# Patient Record
Sex: Female | Born: 1965
Health system: Southern US, Community
[De-identification: ages and names within clinical notes are randomized; demographics above are authoritative.]

## PROBLEM LIST (undated history)

## (undated) DIAGNOSIS — R1031 Right lower quadrant pain: Secondary | ICD-10-CM

## (undated) DIAGNOSIS — E663 Overweight: Secondary | ICD-10-CM

## (undated) DIAGNOSIS — R197 Diarrhea, unspecified: Secondary | ICD-10-CM

## (undated) DIAGNOSIS — F419 Anxiety disorder, unspecified: Secondary | ICD-10-CM

## (undated) DIAGNOSIS — K589 Irritable bowel syndrome without diarrhea: Secondary | ICD-10-CM

## (undated) DIAGNOSIS — R002 Palpitations: Secondary | ICD-10-CM

## (undated) DIAGNOSIS — R109 Unspecified abdominal pain: Secondary | ICD-10-CM

## (undated) DIAGNOSIS — F329 Major depressive disorder, single episode, unspecified: Secondary | ICD-10-CM

## (undated) DIAGNOSIS — R933 Abnormal findings on diagnostic imaging of other parts of digestive tract: Secondary | ICD-10-CM

## (undated) DIAGNOSIS — T7840XA Allergy, unspecified, initial encounter: Secondary | ICD-10-CM

## (undated) DIAGNOSIS — F32A Depression, unspecified: Secondary | ICD-10-CM

## (undated) DIAGNOSIS — K219 Gastro-esophageal reflux disease without esophagitis: Secondary | ICD-10-CM

## (undated) DIAGNOSIS — R740 Nonspecific elevation of levels of transaminase and lactic acid dehydrogenase [LDH]: Secondary | ICD-10-CM

## (undated) DIAGNOSIS — I10 Essential (primary) hypertension: Secondary | ICD-10-CM

## (undated) HISTORY — DX: Allergy, unspecified, initial encounter: T78.40XA

## (undated) HISTORY — DX: Gastro-esophageal reflux disease without esophagitis: K21.9

## (undated) HISTORY — DX: Unspecified abdominal pain: R10.9

## (undated) HISTORY — DX: Nonspecific elevation of levels of transaminase and lactic acid dehydrogenase (ldh): R74.0

## (undated) HISTORY — PX: UPPER GI ENDOSCOPY: SHX6162

## (undated) HISTORY — PX: BUNIONECTOMY: SHX129

## (undated) HISTORY — DX: Overweight: E66.3

## (undated) HISTORY — PX: COLONOSCOPY: SHX174

## (undated) HISTORY — DX: Palpitations: R00.2

## (undated) HISTORY — DX: Right lower quadrant pain: R10.31

## (undated) HISTORY — DX: Anxiety disorder, unspecified: F41.9

## (undated) HISTORY — PX: TREATMENT FISTULA ANAL: SUR1390

## (undated) HISTORY — DX: Depression, unspecified: F32.A

## (undated) HISTORY — DX: Abnormal findings on diagnostic imaging of other parts of digestive tract: R93.3

## (undated) HISTORY — DX: Diarrhea, unspecified: R19.7

## (undated) HISTORY — PX: WISDOM TOOTH EXTRACTION: SHX21

## (undated) HISTORY — PX: PLANTAR FASCIA RELEASE: SHX2239

## (undated) HISTORY — DX: Essential (primary) hypertension: I10

## (undated) HISTORY — PX: ADENOIDECTOMY: SUR15

## (undated) HISTORY — DX: Irritable bowel syndrome without diarrhea: K58.9

## (undated) HISTORY — DX: Major depressive disorder, single episode, unspecified: F32.9

---

## 2005-09-05 ENCOUNTER — Ambulatory Visit (HOSPITAL_BASED_OUTPATIENT_CLINIC_OR_DEPARTMENT_OTHER): Admission: RE | Admit: 2005-09-05 | Discharge: 2005-09-05 | Payer: Self-pay | Admitting: Surgery

## 2005-09-05 ENCOUNTER — Ambulatory Visit (HOSPITAL_COMMUNITY): Admission: RE | Admit: 2005-09-05 | Discharge: 2005-09-05 | Payer: Self-pay | Admitting: Surgery

## 2005-09-25 ENCOUNTER — Ambulatory Visit (HOSPITAL_BASED_OUTPATIENT_CLINIC_OR_DEPARTMENT_OTHER): Admission: RE | Admit: 2005-09-25 | Discharge: 2005-09-25 | Payer: Self-pay | Admitting: Surgery

## 2005-09-25 ENCOUNTER — Ambulatory Visit (HOSPITAL_COMMUNITY): Admission: RE | Admit: 2005-09-25 | Discharge: 2005-09-25 | Payer: Self-pay | Admitting: Surgery

## 2006-08-29 ENCOUNTER — Encounter: Admission: RE | Admit: 2006-08-29 | Discharge: 2006-08-29 | Payer: Self-pay | Admitting: Internal Medicine

## 2006-09-12 ENCOUNTER — Ambulatory Visit: Payer: Self-pay | Admitting: Gastroenterology

## 2006-10-28 ENCOUNTER — Encounter (INDEPENDENT_AMBULATORY_CARE_PROVIDER_SITE_OTHER): Payer: Self-pay | Admitting: Specialist

## 2006-10-28 ENCOUNTER — Ambulatory Visit: Payer: Self-pay | Admitting: Gastroenterology

## 2007-01-31 ENCOUNTER — Emergency Department (HOSPITAL_COMMUNITY): Admission: EM | Admit: 2007-01-31 | Discharge: 2007-01-31 | Payer: Self-pay | Admitting: Emergency Medicine

## 2007-02-03 ENCOUNTER — Ambulatory Visit: Payer: Self-pay | Admitting: Cardiology

## 2007-10-28 ENCOUNTER — Encounter: Admission: RE | Admit: 2007-10-28 | Discharge: 2007-10-28 | Payer: Self-pay | Admitting: Internal Medicine

## 2008-03-29 ENCOUNTER — Other Ambulatory Visit: Admission: RE | Admit: 2008-03-29 | Discharge: 2008-03-29 | Payer: Self-pay | Admitting: Internal Medicine

## 2008-11-26 ENCOUNTER — Ambulatory Visit: Payer: Self-pay | Admitting: Internal Medicine

## 2008-12-10 ENCOUNTER — Ambulatory Visit: Payer: Self-pay | Admitting: Cardiology

## 2009-02-22 ENCOUNTER — Ambulatory Visit: Payer: Self-pay | Admitting: Internal Medicine

## 2009-03-14 ENCOUNTER — Encounter: Admission: RE | Admit: 2009-03-14 | Discharge: 2009-03-14 | Payer: Self-pay | Admitting: Chiropractic Medicine

## 2009-03-17 ENCOUNTER — Encounter: Admission: RE | Admit: 2009-03-17 | Discharge: 2009-03-17 | Payer: Self-pay | Admitting: Internal Medicine

## 2009-05-26 ENCOUNTER — Other Ambulatory Visit: Admission: RE | Admit: 2009-05-26 | Discharge: 2009-05-26 | Payer: Self-pay | Admitting: Internal Medicine

## 2009-05-26 ENCOUNTER — Ambulatory Visit: Payer: Self-pay | Admitting: Internal Medicine

## 2009-05-31 ENCOUNTER — Ambulatory Visit: Payer: Self-pay | Admitting: Internal Medicine

## 2009-07-14 ENCOUNTER — Ambulatory Visit: Payer: Self-pay | Admitting: Internal Medicine

## 2009-07-15 ENCOUNTER — Encounter (INDEPENDENT_AMBULATORY_CARE_PROVIDER_SITE_OTHER): Payer: Self-pay | Admitting: *Deleted

## 2009-07-15 ENCOUNTER — Encounter: Admission: RE | Admit: 2009-07-15 | Discharge: 2009-07-15 | Payer: Self-pay | Admitting: Internal Medicine

## 2009-08-01 ENCOUNTER — Ambulatory Visit: Payer: Self-pay | Admitting: Internal Medicine

## 2009-08-30 ENCOUNTER — Encounter (INDEPENDENT_AMBULATORY_CARE_PROVIDER_SITE_OTHER): Payer: Self-pay | Admitting: *Deleted

## 2009-08-30 ENCOUNTER — Ambulatory Visit: Payer: Self-pay | Admitting: Internal Medicine

## 2009-08-30 ENCOUNTER — Encounter: Admission: RE | Admit: 2009-08-30 | Discharge: 2009-08-30 | Payer: Self-pay | Admitting: Internal Medicine

## 2009-09-22 ENCOUNTER — Encounter: Payer: Self-pay | Admitting: Gastroenterology

## 2009-10-11 ENCOUNTER — Ambulatory Visit: Payer: Self-pay | Admitting: Internal Medicine

## 2009-12-14 ENCOUNTER — Encounter (INDEPENDENT_AMBULATORY_CARE_PROVIDER_SITE_OTHER): Payer: Self-pay | Admitting: *Deleted

## 2009-12-21 ENCOUNTER — Telehealth: Payer: Self-pay | Admitting: Gastroenterology

## 2009-12-22 ENCOUNTER — Encounter: Payer: Self-pay | Admitting: Gastroenterology

## 2009-12-22 ENCOUNTER — Ambulatory Visit: Payer: Self-pay | Admitting: Gastroenterology

## 2009-12-22 DIAGNOSIS — I10 Essential (primary) hypertension: Secondary | ICD-10-CM

## 2009-12-22 DIAGNOSIS — R197 Diarrhea, unspecified: Secondary | ICD-10-CM

## 2009-12-22 DIAGNOSIS — E785 Hyperlipidemia, unspecified: Secondary | ICD-10-CM | POA: Insufficient documentation

## 2009-12-22 DIAGNOSIS — R1031 Right lower quadrant pain: Secondary | ICD-10-CM

## 2009-12-22 DIAGNOSIS — R933 Abnormal findings on diagnostic imaging of other parts of digestive tract: Secondary | ICD-10-CM | POA: Insufficient documentation

## 2009-12-22 HISTORY — DX: Diarrhea, unspecified: R19.7

## 2009-12-22 HISTORY — DX: Abnormal findings on diagnostic imaging of other parts of digestive tract: R93.3

## 2009-12-22 HISTORY — DX: Right lower quadrant pain: R10.31

## 2009-12-22 HISTORY — DX: Essential (primary) hypertension: I10

## 2009-12-23 ENCOUNTER — Telehealth: Payer: Self-pay | Admitting: Gastroenterology

## 2009-12-23 LAB — CONVERTED CEMR LAB
Basophils Relative: 1.3 % (ref 0.0–3.0)
Eosinophils Absolute: 0.5 10*3/uL (ref 0.0–0.7)
Eosinophils Relative: 6.4 % — ABNORMAL HIGH (ref 0.0–5.0)
HCT: 39.9 % (ref 36.0–46.0)
Lymphs Abs: 1.5 10*3/uL (ref 0.7–4.0)
MCHC: 34 g/dL (ref 30.0–36.0)
MCV: 89.4 fL (ref 78.0–100.0)
Monocytes Absolute: 0.5 10*3/uL (ref 0.1–1.0)
Neutro Abs: 5 10*3/uL (ref 1.4–7.7)
Neutrophils Relative %: 65.7 % (ref 43.0–77.0)
RBC: 4.46 M/uL (ref 3.87–5.11)

## 2009-12-27 ENCOUNTER — Ambulatory Visit: Payer: Self-pay | Admitting: Gastroenterology

## 2009-12-28 ENCOUNTER — Telehealth: Payer: Self-pay | Admitting: Gastroenterology

## 2009-12-29 ENCOUNTER — Encounter: Payer: Self-pay | Admitting: Gastroenterology

## 2010-01-02 ENCOUNTER — Telehealth: Payer: Self-pay | Admitting: Gastroenterology

## 2010-01-10 ENCOUNTER — Encounter (INDEPENDENT_AMBULATORY_CARE_PROVIDER_SITE_OTHER): Payer: Self-pay | Admitting: *Deleted

## 2010-01-10 ENCOUNTER — Ambulatory Visit: Payer: Self-pay | Admitting: Gastroenterology

## 2010-01-10 DIAGNOSIS — R1084 Generalized abdominal pain: Secondary | ICD-10-CM | POA: Insufficient documentation

## 2010-01-10 DIAGNOSIS — R7401 Elevation of levels of liver transaminase levels: Secondary | ICD-10-CM

## 2010-01-10 DIAGNOSIS — K589 Irritable bowel syndrome without diarrhea: Secondary | ICD-10-CM

## 2010-01-10 DIAGNOSIS — F411 Generalized anxiety disorder: Secondary | ICD-10-CM | POA: Insufficient documentation

## 2010-01-10 DIAGNOSIS — R740 Nonspecific elevation of levels of transaminase and lactic acid dehydrogenase [LDH]: Secondary | ICD-10-CM

## 2010-01-10 DIAGNOSIS — R109 Unspecified abdominal pain: Secondary | ICD-10-CM

## 2010-01-10 HISTORY — DX: Elevation of levels of liver transaminase levels: R74.01

## 2010-01-10 HISTORY — DX: Unspecified abdominal pain: R10.9

## 2010-01-10 HISTORY — DX: Irritable bowel syndrome, unspecified: K58.9

## 2010-01-12 ENCOUNTER — Encounter: Admission: RE | Admit: 2010-01-12 | Discharge: 2010-01-12 | Payer: Self-pay | Admitting: Gastroenterology

## 2010-01-20 ENCOUNTER — Ambulatory Visit (HOSPITAL_COMMUNITY): Admission: RE | Admit: 2010-01-20 | Discharge: 2010-01-20 | Payer: Self-pay | Admitting: Gastroenterology

## 2010-01-26 ENCOUNTER — Ambulatory Visit: Payer: Self-pay | Admitting: Gastroenterology

## 2010-02-08 ENCOUNTER — Telehealth: Payer: Self-pay | Admitting: Gastroenterology

## 2010-02-10 ENCOUNTER — Telehealth: Payer: Self-pay | Admitting: Gastroenterology

## 2010-02-15 ENCOUNTER — Ambulatory Visit: Payer: Self-pay | Admitting: Internal Medicine

## 2010-02-21 ENCOUNTER — Telehealth: Payer: Self-pay | Admitting: Gastroenterology

## 2010-03-29 ENCOUNTER — Encounter: Admission: RE | Admit: 2010-03-29 | Discharge: 2010-03-29 | Payer: Self-pay | Admitting: Family Medicine

## 2010-08-01 ENCOUNTER — Other Ambulatory Visit: Admission: RE | Admit: 2010-08-01 | Discharge: 2010-08-01 | Payer: Self-pay | Admitting: Family Medicine

## 2010-12-09 ENCOUNTER — Encounter: Payer: Self-pay | Admitting: Internal Medicine

## 2010-12-19 NOTE — Progress Notes (Signed)
Summary: Speak to Dr Russella Dar   Phone Note Call from Patient Call back at Home Phone [phone]   Caller: Erie Noe @ Dr Cleta Alberts [phone]  Call For: Dr Russella Dar Summary of Call: Would like to speak to Dr Russella Dar today please.  Initial call taken by: Leanor Kail [facility],  December 21, 2009 12:54 PM  Follow-up for Phone Call        I have left a message for the patient to call me back about scheduling an appointment .  Dr Russella Dar has spoke with Dr Cleta Alberts and patient has RUQ tenderness and a abnormal CT scan. Follow-up by: Darcey Nora RN, CGRN,  December 21, 2009 2:20 PM  Additional Follow-up for Phone Call Additional follow up Details #1::        Patient  is scheduled to come see Mike Gip PA 2-3-119:00 Additional Follow-up by: Darcey Nora RN, CGRN,  December 21, 2009 2:37 PM

## 2010-12-19 NOTE — Progress Notes (Signed)
Summary: Meds are making her sick  Medications Added DEXILANT 60 MG CPDR (DEXLANSOPRAZOLE) 1 by mouth qd NEXIUM 40 MG CPDR (ESOMEPRAZOLE MAGNESIUM) 1 by mouth qd       Phone Note Call from Patient Call back at Work Phone [phone]   Call For: Dr Russella Dar Summary of Call: Meds is making her sick. Initial call taken by: Leanor Kail [facility],  February 10, 2010 10:08 AM  Follow-up for Phone Call        Left message for patient to call back Darcey Nora RN, [facility]  February 10, 2010 10:56 AM  Patient has bloating, diarrhea and abdominal pain  usually starts several hours after her PPI.  She has tried taking  Prilosec and Protonix. Same symptoms with both.  Please advise.    Follow-up by: Darcey Nora RN, CGRN,  February 10, 2010 11:36 AM  Additional Follow-up for Phone Call Additional follow up Details #1::        She needs a PPI for erosive esophagitis. Try Aciphex or Nexium for 5-10 days each. Can provide samples if available. Additional Follow-up by: Meryl Dare MD [facility] Graham,  February 10, 2010 11:51 AM    Additional Follow-up for Phone Call Additional follow up Details #2::    We have no samples of Aciphex.  I have given her 2 weeks each of Nexium and Dexilant.  She will call me back with which one helps.   Follow-up by: Darcey Nora RN, CGRN,  February 10, 2010 11:56 AM  New/Updated Medications: DEXILANT 60 MG CPDR (DEXLANSOPRAZOLE) 1 by mouth qd NEXIUM 40 MG CPDR (ESOMEPRAZOLE MAGNESIUM) 1 by mouth qd

## 2010-12-19 NOTE — Progress Notes (Signed)
Summary: results and procedure ?   Phone Note Call from Patient Call back at [facility] Phone [phone]   Caller: Patient Call For: Dr. Russella Dar Reason for Call: Talk to Nurse Summary of Call: 1. can pt wear her contacts during her procedure? 2. would like labwork results Initial call taken by: Vallarie Mare,  December 23, 2009 8:31 AM  Follow-up for Phone Call        patient advised ok to wear contacts, and of lab results. Follow-up by: Darcey Nora RN, CGRN,  December 23, 2009 8:38 AM

## 2010-12-19 NOTE — Letter (Signed)
Summary: New Patient letter  [facility]  [address] [place], [place] [address]   Phone: [phone]  Fax: [phone]       12/14/2009 MRN: [id number]  Alejandra Reed [address] [place], [place]  [address]  Dear Ms. Alejandra Reed,  Welcome to the Gastroenterology Division at [facility].    You are scheduled to see Dr. Russella Dar on 01-10-10 at 8:45a.m. on the 3rd floor at [facility], [address]  We ask that you try to arrive at our office 15 minutes prior to your appointment time to allow for check-in.  We would like you to complete the enclosed self-administered evaluation form prior to your visit and bring it with you on the day of your appointment.  We will review it with you.  Also, please bring a complete list of all your medications or, if you prefer, bring the medication bottles and we will list them.  Please bring your insurance card so that we may make a copy of it.  If your insurance requires a referral to see a specialist, please bring your referral form from your primary care physician.  Co-payments are due at the time of your visit and may be paid by cash, check or credit card.     Your office visit will consist of a consult with your physician (includes a physical exam), any laboratory testing he/she may order, scheduling of any necessary diagnostic testing (e.g. x-ray, ultrasound, CT-scan), and scheduling of a procedure (e.g. Endoscopy, Colonoscopy) if required.  Please allow enough time on your schedule to allow for any/all of these possibilities.    If you cannot keep your appointment, please call [phone] to cancel or reschedule prior to your appointment date.  This allows [place] the opportunity to schedule an appointment for another patient in need of care.  If you do not cancel or reschedule by 5 p.m. the business day prior to your appointment date, you will be charged a $50.00 late cancellation/no-show fee.    Thank you for choosing  [facility] for your medical needs.  We appreciate the opportunity to care for you.  Please visit [place] at our website  to learn more about our practice.                     Sincerely,                                                             The Gastroenterology Division

## 2010-12-19 NOTE — Procedures (Signed)
Summary: Colonoscopy and biopsy   Colonoscopy  Procedure date:  10/28/2006  Findings:      Results: Normal. Location:  [facility].    Procedures Next Due Date:    Colonoscopy: 10/2011 Patient Name: Alejandra Reed, Alejandra Reed MRN:  Procedure Procedures: Colonoscopy CPT: [address].    with biopsy. CPT: Q5068410.  Personnel: Endoscopist: Venita Lick. Russella Dar, MD, Clementeen Graham.  Referred By: Sharlet Salina, MD.  Exam Location: Exam performed in Outpatient Clinic. Outpatient  Patient Consent: Procedure, Alternatives, Risks and Benefits discussed, consent obtained, from patient. Consent was obtained by the RN.  Indications Symptoms: Diarrhea Abdominal pain / bloating.  Increased Risk Screening: For family history of colorectal neoplasia, in  parent age at onset: 58.  Comments: Mother with colon cancer and a 24 yo sister with colon polyps. History  Current Medications: Patient is not currently taking Coumadin.  Pre-Exam Physical: Performed Oct 28, 2006. Cardio-pulmonary exam, Rectal exam, HEENT exam , Abdominal exam, Mental status exam WNL.  Comments: Pt. history reviewed/updated, physical exam performed prior to initiation of sedation?Yes Exam Exam: Extent of exam reached: Terminal Ileum, extent intended: Terminal Ileum.  The cecum was identified by appendiceal orifice and IC valve. Time to Cecum: 00:00:50. Time for Withdrawl: 00:08:25. Colon retroflexion performed. ASA Classification: II. Tolerance: excellent.  Monitoring: Pulse and BP monitoring, Oximetry used. Supplemental O2 given.  Colon Prep Used MoviPrep' for colon prep. Prep results: excellent.  Sedation Meds: Patient assessed and found to be appropriate for moderate (conscious) sedation. Fentanyl 75 mcg. given IV. Versed 7 mg. given IV.  Findings NORMAL EXAM: Terminal Ileum.  NORMAL EXAM: Cecum to Rectum. Biopsy/Normal Exam taken. Comments: random biopsies taken.   Assessment Normal examination.   Events  Unplanned Interventions: No intervention was required.  Unplanned Events: There were no complications. Plans  Post Exam Instructions: Post sedation instructions given.  Medication Plan: Await pathology.  Patient Education: Patient given standard instructions for: IBS.  Disposition: After procedure patient sent to recovery. After recovery patient sent home.  Scheduling/Referral: Colonoscopy, to [facility] T. Russella Dar, MD, [facility], around Oct 29, 2011.    This report was created from the original endoscopy report, which was reviewed and signed by the above listed endoscopist.    cc: Sharlet Salina, MD         SP Surgical Pathology - STATUS: Final             By: Morrie Sheldon,       Perform Date: 10Dec07 00:01  Ordered By: Rica Records Date: 10Dec07 22:02  Facility: [facility]                               Department: CPATH  Service Report Text  [facility]   [address] [address]   [place], [place] [address]   Telephone [phone] or [phone] Fax [phone]    REPORT OF SURGICAL PATHOLOGY    Case #: [id number]   Patient Name: Alejandra Reed, Alejandra Reed   Office Chart Number: [id number]    MRN: [id number]   Pathologist: Beulah Gandy. Luisa Hart, MD   DOB/Age 12-08-1965 (Age: 45) Gender: F   Date Taken: 10/28/2006   Date Received: 10/28/2006    FINAL DIAGNOSIS    ***MICROSCOPIC EXAMINATION AND DIAGNOSIS***    COLON, RANDOM BIOPSIES: BENIGN COLONIC MUCOSA. NO ACTIVE   INFLAMMATION, MICROSCOPIC COLITIS OR GRANULOMAS.  COMMENT   There is colorectal mucosa with normal crypt architecture and no   objective increase in inflammation. No active inflammation,   microscopic colitis, collagenous colitis or significant chronic   changes identified. No hyperplastic or adenomatous changes are   seen, and there is no evidence of malignancy. (JDP:caf   10/29/06)    cf   Date Reported: 10/29/2006 Beulah Gandy. Luisa Hart, MD   ***  Electronically Signed Out By JDP ***    Clinical information   Diarrhea R/O colitis (jes)    specimen(s) obtained   Colon, biopsy, random    Gross Description   Received in formalin are tan, soft tissue fragments that are   submitted in toto. Number: multiple.   Size: 0.4 to 0.7 cm One block (ML:jes,10/29/06)    jes/

## 2010-12-19 NOTE — Letter (Signed)
Summary: Patient Notice- Colon Biospy Results  [facility]  [address] [place], [place] [address]   Phone: [phone]  Fax: [phone]        December 29, 2009 MRN: [id number]    TAL NEER [address] [place], [place]  [address]    Dear Ms. Mottern,  I am pleased to inform you that the biopsies taken during your recent colonoscopy did not show any evidence of cancer upon pathologic examination. The biopsies were normal.  Continue with the treatment plan as outlined on the day of your      exam.  You should have a repeat colonoscopy examination in 5 years.  Please call us if you are having persistent problems or have questions about your condition that have not been fully answered at this time.  Sincerely,  Meryl Dare MD [facility]  This letter has been electronically signed by your physician.  Appended Document: Patient Notice- Colon Biospy Results letter mailed 2.11.11

## 2010-12-19 NOTE — Procedures (Signed)
Summary: Colonoscopy  Patient: Alejandra Reed Note: All result statuses are Final unless otherwise noted.  Tests: (1) Colonoscopy (COL)   COL Colonoscopy           DONE     [facility]     [address]     [place], [place]  [address]           COLONOSCOPY PROCEDURE REPORT           PATIENT:  Alejandra Reed, Alejandra Reed  MR#:  [id number]     BIRTHDATE:  August 19, 1966, 43 yrs. old  GENDER:  female           ENDOSCOPIST:  Judie Petit T. Russella Dar, MD, [facility]           PROCEDURE DATE:  12/27/2009     PROCEDURE:  Colonoscopy with biopsy     ASA CLASS:  Class II     INDICATIONS:  1) unexplained diarrhea, constipation  2) abnormal     CT of abdomen  3) abdominal pain-RLQ  4) family history of colon     cancer-mother with colon cancer at 79.           MEDICATIONS:   Fentanyl 75 mcg IV, Versed 8 mg IV           DESCRIPTION OF PROCEDURE:   After the risks benefits and     alternatives of the procedure were thoroughly explained, informed     consent was obtained.  Digital rectal exam was performed and     revealed no abnormalities.   The LB [id number]-[id number] endoscope     was introduced through the anus and advanced to the terminal ileum     which was intubated for a short distance, without limitations.     The quality of the prep was good, using MoviPrep.  The instrument     was then slowly withdrawn as the colon was fully examined.     <<PROCEDUREIMAGES>>           FINDINGS:  The terminal ileum appeared normal. Random biopsies     were obtained and sent to pathology.  A normal appearing cecum,     ileocecal valve, and appendiceal orifice were identified. The     ascending, hepatic flexure, transverse, splenic flexure,     descending, sigmoid colon, and rectum appeared unremarkable.     Random biopsies were obtained and sent to pathology. Retroflexed     views in the rectum revealed internal hemorrhoids, small. The time     to cecum =  1.33  minutes. The scope was then withdrawn (time =     8.25   min) from the patient and the procedure completed.           COMPLICATIONS:  None           ENDOSCOPIC IMPRESSION:     1) Normal terminal ileum     2) Normal colon     3) Internal hemorrhoids           RECOMMENDATIONS:     1) Await pathology results     2) call office to schedule followup visit in 4-6 weeks     3) Repeat Colonoscopy in 5 years.     4) DC Bentyl     5) glycopyrrolate 2mg  po bid prn abd pain/bloating, #60, 5 refills                 Ameenah Prosser T. Russella Dar, MD, Clementeen Graham  CC: Lesle Chris, MD           n.     Rosalie DoctorVenita Lick. Burt Piatek at 12/27/2009 03:38 PM           Valentina Gu, [id number]  Note: An exclamation mark (!) indicates a result that was not dispersed into the flowsheet. Document Creation Date: 12/28/2009 10:18 AM _______________________________________________________________________  (1) Order result status: Final Collection or observation date-time: 12/27/2009 15:30 Requested date-time:  Receipt date-time:  Reported date-time:  Referring Physician:   Ordering Physician: Claudette Head [phone]) Specimen Source:  Source: Launa Grill Order Number: [id number] Lab site:   Appended Document: Colonoscopy     Procedures Next Due Date:    Colonoscopy: 12/2014

## 2010-12-19 NOTE — Letter (Signed)
Summary: EGD Instructions  [facility]  [address] [place], [place] [address]   Phone: [phone]  Fax: [phone]       Alejandra Reed    June 12, 1966    MRN: [id number]       Procedure Day /Date: Thursday March 10th, 2011     Arrival Time:  2:00pm     Procedure Time: 3:00pm     Location of Procedure:                    _  x_ [facility] (4th Floor)    PREPARATION FOR ENDOSCOPY   On 01/26/10  THE DAY OF THE PROCEDURE:  1.   No solid foods, milk or milk products are allowed after midnight the night before your procedure.  2.   Do not drink anything colored red or purple.  Avoid juices with pulp.  No orange juice.  3.  You may drink clear liquids until 1:00pm , which is 2 hours before your procedure.                                                                                                CLEAR LIQUIDS INCLUDE: Water Jello Ice Popsicles Tea (sugar ok, no milk/cream) Powdered fruit flavored drinks Coffee (sugar ok, no milk/cream) Gatorade Juice: apple, white grape, white cranberry  Lemonade Clear bullion, consomm, broth Carbonated beverages (any kind) Strained chicken noodle soup Hard Candy   MEDICATION INSTRUCTIONS  Unless otherwise instructed, you should take regular prescription medications with a small sip of water as early as possible the morning of your procedure.           OTHER INSTRUCTIONS  You will need a responsible adult at least 45 years of age to accompany you and drive you home.   This person must remain in the waiting room during your procedure.  Wear loose fitting clothing that is easily removed.  Leave jewelry and other valuables at home.  However, you may wish to bring a book to read or an iPod/MP3 player to listen to music as you wait for your procedure to start.  Remove all body piercing jewelry and leave at home.  Total time from sign-in until discharge is approximately 2-3 hours.  You should go  home directly after your procedure and rest.  You can resume normal activities the day after your procedure.  The day of your procedure you should not:   Drive   Make legal decisions   Operate machinery   Drink alcohol   Return to work  You will receive specific instructions about eating, activities and medications before you leave.    The above instructions have been reviewed and explained to me by   Marchelle Folks.     I fully understand and can verbalize these instructions _____________________________ Date _________

## 2010-12-19 NOTE — Letter (Signed)
Summary: [facility] Instructions  [facility]  [address] [place], [place] [address]   Phone: [phone]  Fax: [phone]       SAPIR LAVEY    1966-08-07    MRN: [id number]        Procedure Day /Date: 12-27-09     Arrival Time: 2:30 PM     Procedure Time: 3:30 PM     Location of Procedure:                    X     [facility] (4th Floor)  PREPARATION FOR COLONOSCOPY WITH MOVIPREP   Starting 5 days prior to your procedure 12-22-09 do not eat nuts, seeds, popcorn, corn, beans, peas,  salads, or any raw vegetables.  Do not take any fiber supplements (e.g. Metamucil, Citrucel, and Benefiber).  THE DAY BEFORE YOUR PROCEDURE         DATE: 12-26-09   DAY: Monday  1.  Drink clear liquids the entire day-NO SOLID FOOD  2.  Do not drink anything colored red or purple.  Avoid juices with pulp.  No orange juice.  3.  Drink at least 64 oz. (8 glasses) of fluid/clear liquids during the day to prevent dehydration and help the prep work efficiently.  CLEAR LIQUIDS INCLUDE: Water Jello Ice Popsicles Tea (sugar ok, no milk/cream) Powdered fruit flavored drinks Coffee (sugar ok, no milk/cream) Gatorade Juice: apple, white grape, white cranberry  Lemonade Clear bullion, consomm, broth Carbonated beverages (any kind) Strained chicken noodle soup Hard Candy                             4.  In the morning, mix first dose of MoviPrep solution:    Empty 1 Pouch A and 1 Pouch B into the disposable container    Add lukewarm drinking water to the top line of the container. Mix to dissolve    Refrigerate (mixed solution should be used within 24 hrs)  5.  Begin drinking the prep at 5:00 p.m. The MoviPrep container is divided by 4 marks.   Every 15 minutes drink the solution down to the next mark (approximately 8 oz) until the full liter is complete.   6.  Follow completed prep with 16 oz of clear liquid of your choice (Nothing red or purple).  Continue to drink  clear liquids until bedtime.  7.  Before going to bed, mix second dose of MoviPrep solution:    Empty 1 Pouch A and 1 Pouch B into the disposable container    Add lukewarm drinking water to the top line of the container. Mix to dissolve    Refrigerate  THE DAY OF YOUR PROCEDURE      DATE: 12-27-09 DAY: Tuesday  Beginning at 10:30 AM (5 hours before procedure):         1. Every 15 minutes, drink the solution down to the next mark (approx 8 oz) until the full liter is complete.  2. Follow completed prep with 16 oz. of clear liquid of your choice.    3. You may drink clear liquids until 1:30 PM  (2 HOURS BEFORE PROCEDURE).   MEDICATION INSTRUCTIONS  Unless otherwise instructed, you should take regular prescription medications with a small sip of water   as early as possible the morning of your procedure.       OTHER INSTRUCTIONS  You will need a responsible adult at least  45 years of age to accompany you and drive you home.   This person must remain in the waiting room during your procedure.  Wear loose fitting clothing that is easily removed.  Leave jewelry and other valuables at home.  However, you may wish to bring a book to read or  an iPod/MP3 player to listen to music as you wait for your procedure to start.  Remove all body piercing jewelry and leave at home.  Total time from sign-in until discharge is approximately 2-3 hours.  You should go home directly after your procedure and rest.  You can resume normal activities the  day after your procedure.  The day of your procedure you should not:   Drive   Make legal decisions   Operate machinery   Drink alcohol   Return to work  You will receive specific instructions about eating, activities and medications before you leave.    The above instructions have been reviewed and explained to me by   _______________________    I fully understand and can verbalize these instructions  _____________________________ Date _________

## 2010-12-19 NOTE — Assessment & Plan Note (Signed)
Summary: ruq pain, abnormal CT scan    History of Present Illness Visit Type: consult  Primary GI MD: Elie Goody MD [facility] Primary Provider: Marlan Palau, MD  Requesting Provider: Lesle Chris, MD  Chief Complaint: RUQ abd pain and abdnomal ct  History of Present Illness:   45 YO FEMALE KNOWN TO DR. Russella Dar FROM PRIOR COLONOSCOPY DONE IN 12/07 FOR SCREENING WITH FAMILY HX OF COLON CANCER IN PTS MOTHER. SHE ALSO HAS A SISTER WITH CROHNS DISEASE. SHE COMES IN TODAY WITH C/O SEVERAL MONTH HX OF PRIMARILY RIGHT SIDED ABDOMINAL PAIN AND DISCOMFORT WHICH IS INTERMITTENT. SHE HAS  BEEN HAVING ALTERNATING EPISODES OF DIARRHEA AND CONSTIPATION, NO MELENA OR HEME. SHE HAS BEEN LOSING WEIGHT BUT THIS IS INTENTIONAL;1-2 POUNDS/WEEK. OVER THE PAST 2 WEEKS HER SXS HAVE BEEN WORSE WITH MORE ON GOING DISCOMFORT. SHE HAD A CT SCAN DONE IN 8/10 PER DR. BAXLEY THAT SHOWED THICKENING OF THE TERMINAL ILEUM AND MILDLY PROMINENT ILEOCOLIC MESENTERIC LYMPH NODES.LABS DONE 12/21/09;WBC 10.3,HGB13.6/HCT 42.9. ALSO MENTIONS HX OF ANAL FISTULA WITH REPAIR 2006,THINKS IT MAY BE BACK BUT NO PAIN, NO DRAINAGE.   GI Review of Systems    Reports abdominal pain and  weight loss.     Location of  Abdominal pain: RLQ. Weight loss of 1-2 PER WEEK pounds   Denies acid reflux, belching, bloating, chest pain, dysphagia with liquids, dysphagia with solids, heartburn, loss of appetite, nausea, vomiting, and  vomiting blood.      Reports anal fissure,change in bowel habits, constipation, and  diarrhea.     Denies black tarry stools, diverticulosis, fecal incontinence, heme positive stool, hemorrhoids, irritable bowel syndrome, jaundice, light color stool, liver problems, rectal bleeding, and  rectal pain.    Current Medications (verified): 1)  Flonase 50 Mcg/act Susp (Fluticasone Propionate) .... As Directed 2)  Atenolol 25 Mg Tabs (Atenolol) .... One Tablet By Mouth Two Times A Day 3)  Zocor 10 Mg Tabs (Simvastatin) .... One Tablet  By Mouth Once Daily 4)  Bentyl 20 Mg Tabs (Dicyclomine Hcl) .... 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  Alcohol Use - no Illicit Drug Use - no Smoking Status:  never Drug Use:  no  Review of Systems       The patient complains of allergy/sinus, anxiety-new, back pain, headaches-new, night sweats, and sleeping problems.  The patient denies anemia, arthritis/joint pain, blood in urine, breast changes/lumps, change in vision, confusion, cough, coughing up blood, depression-new, fainting, fatigue, fever, hearing problems, heart murmur, heart rhythm changes, itching, menstrual pain, muscle pains/cramps, nosebleeds, pregnancy symptoms, shortness of breath, skin rash, sore throat, swelling of feet/legs, swollen lymph glands, thirst - excessive , urination - excessive , urination changes/pain, urine leakage, vision changes, and voice change.         ROS OTHERWISE AS IN HPI  Vital Signs:  Patient profile:   45 year old female Height:      65 inches Weight:      234 pounds BMI:  39.08 BSA:     2.12 Pulse rate:   76 / minute Pulse rhythm:   regular BP sitting:   122 / 80  (left arm) Cuff size:   regular  Vitals Entered By: Ok Anis CMA (December 22, 2009 9:15 AM)  Physical Exam  General:  Well developed, well nourished, no acute distress. Head:  Normocephalic and atraumatic. Eyes:  PERRLA, no icterus. Lungs:  Clear throughout to  auscultation. Heart:  Regular rate and rhythm; no murmurs, rubs,  or bruits. Abdomen:  SOFT, TENDER RLQ,MILD LLQ, NO MASS OR HSM,BS+ Rectal:  STOOL HEME NEGATIVE,SCAR FROM PRIOR SURGERY, BUT CANNOT IDENTIFY A FITULA  Extremities:  No clubbing, cyanosis, edema or deformities noted. Neurologic:  Alert and  oriented x4;  grossly normal neurologically. Psych:  Alert and cooperative. Normal mood and affect.   Impression & Recommendations:  Problem # 1:  RLQ PAIN (ICD-789.03) Assessment New 45 YO FEMALE WITH  SEVERAL MONTH HX OF INTERMITTENT RLQ PAIN,DISCOMFORT,CHANGE IN BOWEL HABITS, AND ABNORMAL CT SCAN SUGGESTIVE OF POSSIBLE ILEITIS 8/10  CONTINUE BENTYL 10MG   4 X DAILY AS NEEDED. SCHEDULE FOR COLONOSCOPY WITH BXS OF T.I. WITH DR Jalene Mullet DISCUSSED IN DETAIL WITH PT. CBC,AND CRP TODAY Orders: TLB-CBC Platelet - w/Differential (85025-CBCD) TLB-CRP-High Sensitivity (C-Reactive Protein) (86140-FCRP) Colonoscopy (Colon)  Problem # 2:  ADENOCARCINOMA, COLON, FAMILY HX (ICD-V16.0) Assessment: Comment Only SEE ABOVE Orders: Colonoscopy (Colon)  Problem # 3:  HYPERTENSION (ICD-401.9) Assessment: Comment Only  Problem # 4:  HYPERLIPIDEMIA (ICD-272.4) Assessment: Comment Only  Patient Instructions: 1)  Perscription electronically sent to [facility] for [employer], colonoscopy prep. 2)  Your physician has requested that you have the following labwork done today: Please go to basement level. 3)  Colonoscopy brochure provided. 4)  Copy sent to : Lesle Chris, MD 5)  The medication list was reviewed and reconciled.  All changed / newly prescribed medications were explained.  A complete medication list was provided to the patient / caregiver. Prescriptions: BENTYL 20 MG TABS (DICYCLOMINE HCL) one tablet by mouth  three times a day as needed  #120 x 1   Entered by:   Lowry Ram NCMA   Authorized by:   Sammuel Cooper PA-c   Signed by:   Lowry Ram NCMA on  12/22/2009   Method used:   Electronically to        [facility] [phone]* (retail)       [address]       [place], [place]  [address]       Ph: [phone]       Fax: [phone]   RxID:   [id number] MOVIPREP 100 GM  SOLR (PEG-KCL-NACL-NASULF-NA ASC-C) As per prep instructions.  #1 x 0   Entered by:   Lowry Ram NCMA   Authorized by:   Sammuel Cooper PA-c   Signed by:   Lowry Ram NCMA on 12/22/2009   Method used:   Electronically to        [facility] [phone]* (retail)       [address]       [place], [place]  [address]       Ph: [phone]       Fax: [phone]   RxID:   [id number]

## 2010-12-19 NOTE — Progress Notes (Signed)
Summary: Was here yesterday   Phone Note Call from Patient Call back at cell [phone]    Call For: Dr Russella Dar Summary of Call: Glycopyrrolate read some o fthe reviews on it and it says that it can cause irregular heart beats. Is already on meds because her heart skips a beat so she is very concerned about taking this medication. Initial call taken by: Leanor Kail [facility],  December 28, 2009 11:21 AM  Follow-up for Phone Call        Pt states she takes a beta blocker for irregular heart rhythm. She is concered that this medication will effect her and her other medications. I told pt to to try the medication first but to start it at 1/2 tablet by mouth two times a day at first then incease to 1 tablet by mouth two times a day. Pt states she will try the medication first. Informed pt to call back with any further symptoms or concerns.  Follow-up by: Christie Nottingham CMA Duncan Dull),  December 28, 2009 11:41 AM

## 2010-12-19 NOTE — Progress Notes (Signed)
Summary: Medication refill  Medications Added NEXIUM 40 MG CPDR (ESOMEPRAZOLE MAGNESIUM) 1 by mouth qd       Phone Note Call from Patient Call back at Work Phone [phone]   Caller: Patient Call For: Dr. Russella Dar Reason for Call: Refill Medication Summary of Call: Needs a script for Nexium called [facility] Initial call taken by: Karna Christmas,  February 21, 2010 9:21 AM  Follow-up for Phone Call        Rx was sent to pts pharmacy.  Follow-up by: Christie Nottingham CMA Duncan Dull),  February 21, 2010 9:42 AM    New/Updated Medications: NEXIUM 40 MG CPDR (ESOMEPRAZOLE MAGNESIUM) 1 by mouth qd Prescriptions: NEXIUM 40 MG CPDR (ESOMEPRAZOLE MAGNESIUM) 1 by mouth qd  #30 x 11   Entered by:   Christie Nottingham CMA (AAMA)   Authorized by:   Meryl Dare MD [facility]   Signed by:   Christie Nottingham CMA (AAMA) on 02/21/2010   Method used:   Electronically to        [facility] [phone]* (retail)       [address]       [place], [place]  [address]       Ph: [phone]       Fax: [phone]   RxID:   [id number]

## 2010-12-19 NOTE — Progress Notes (Signed)
Summary: Triage  Medications Added LEVBID 0.375 MG XR12H-TAB (HYOSCYAMINE SULFATE) 1/2-1 tablet by mouth two times a day as needed for abdominal pain and bloating.       Phone Note Call from Patient Call back at Work Phone [phone]   Caller: Patient Call For: Dr. Russella Dar Reason for Call: Talk to Nurse Summary of Call: Needs another prescription besides the Glycopyrrolate. She said it has "bad side effects" Initial call taken by: Karna Christmas,  January 02, 2010 8:26 AM  Follow-up for Phone Call        left message for pt  to call back  Follow-up by: Christie Nottingham CMA Duncan Dull),  January 02, 2010 10:55 AM  Additional Follow-up for Phone Call Additional follow up Details #1::        Pt states after taking medicine over the weekend she started having muscle soreness and chest pain and immediatley stopped the medicine. Pt states after she stopped the robinul the side effects went away. See prior phone note from 12/28/09 about this medication.  Please advise. Additional Follow-up by: Christie Nottingham CMA Duncan Dull),  January 02, 2010 11:06 AM    Additional Follow-up for Phone Call Additional follow up Details #2::    DC glycopyrrolate.  Levbid, one half to one whole tablet po  two times a day as needed abd pain, bloating, #60, 5 refills Follow-up by: Meryl Dare MD [facility],  January 02, 2010 11:16 AM  Additional Follow-up for Phone Call Additional follow up Details #3:: Details for Additional Follow-up Action Taken: left a message telling pt that to dc glycopyrrolate and start Levbid and I will send this is in to the pharmacy instead. Told pt to call back with any questions or concerns.  Additional Follow-up by: Christie Nottingham CMA Duncan Dull),  January 02, 2010 11:57 AM  New/Updated Medications: LEVBID 0.375 MG XR12H-TAB (HYOSCYAMINE SULFATE) 1/2-1 tablet by mouth two times a day as needed for abdominal pain and bloating. Prescriptions: LEVBID 0.375 MG XR12H-TAB (HYOSCYAMINE  SULFATE) 1/2-1 tablet by mouth two times a day as needed for abdominal pain and bloating.  #60 x 5   Entered by:   Christie Nottingham CMA (AAMA)   Authorized by:   Meryl Dare MD [facility]   Signed by:   Christie Nottingham CMA (AAMA) on 01/02/2010   Method used:   Electronically to        [facility] [phone]* (retail)       [address]       [place], [place]  [address]       Ph: [phone]       Fax: [phone]   RxID:   [id number]

## 2010-12-19 NOTE — Miscellaneous (Signed)
Summary: Omeprazole Rx  Clinical Lists Changes  Medications: Added new medication of OMEPRAZOLE 40 MG  CPDR (OMEPRAZOLE) 1 each day 30 minutes before meal - Signed Rx of OMEPRAZOLE 40 MG  CPDR (OMEPRAZOLE) 1 each day 30 minutes before meal;  #30 x 11;  Signed;  Entered by: Doristine Church RN II;  Authorized by: Meryl Dare MD [facility];  Method used: Electronically to [facility] [phone]*, [address], [place], [place], [place]  [address], Ph: [phone], Fax: [phone] Observations: Added new observation of ALLERGY REV: Done (01/26/2010 15:57)    Prescriptions: OMEPRAZOLE 40 MG  CPDR (OMEPRAZOLE) 1 each day 30 minutes before meal  #30 x 11   Entered by:   Doristine Church RN II   Authorized by:   Meryl Dare MD [facility]   Signed by:   Doristine Church RN II on 01/26/2010   Method used:   Electronically to        [facility] [phone]* (retail)       [address]       [place], [place]  [address]       Ph: [phone]       Fax: [phone]   RxID:   [id number]

## 2010-12-19 NOTE — Miscellaneous (Signed)
Summary: glycopyrrolate order  Clinical Lists Changes  Medications: Added new medication of GLYCOPYRROLATE 2 MG  TABS (GLYCOPYRROLATE) glycopyrrolate 2 mg by mouth   two times a day as needed abd pain/bloating - Signed Rx of GLYCOPYRROLATE 2 MG  TABS (GLYCOPYRROLATE) glycopyrrolate 2 mg by mouth   two times a day as needed abd pain/bloating;  #60 x 5;  Signed;  Entered by: Alease Frame RN;  Authorized by: Meryl Dare MD [facility];  Method used: Electronically to [facility] [phone]*, [address] [place], [place], [place]  [address], Ph: [phone], Fax: [phone] Allergies: Added new allergy or adverse reaction of PREDNISONE Observations: Added new observation of NKA: F (12/27/2009 15:56)    Prescriptions: GLYCOPYRROLATE 2 MG  TABS (GLYCOPYRROLATE) glycopyrrolate 2 mg by mouth   two times a day as needed abd pain/bloating  #60 x 5   Entered by:   Alease Frame RN   Authorized by:   Meryl Dare MD [facility]   Signed by:   Alease Frame RN on 12/27/2009   Method used:   Electronically to        [facility] [phone]* (retail)       [address]       [place], [place]  [address]       Ph: [phone]       Fax: [phone]   RxID:   [id number]

## 2010-12-19 NOTE — Assessment & Plan Note (Signed)
Summary: abdominal pain--ch.   History of Present Illness Visit Type: Follow-up Visit Primary GI MD: Elie Goody MD [facility] Primary Provider: Marlan Palau, MD  Requesting Provider: Lesle Chris, MD  Chief Complaint: generalized abdominal pain, some nausea, gas and belching  History of Present Illness:   Alejandra Reed returns with intermittent generalized abdominal pain, bloating, nausea and gas. She occasionally has right upper quadrant pain without generalized pain.  Her prior CT scan showed slight thickening of the distal esophagus and under distention was questioned.  Recent blood work showed a mild elevation in transaminases with an AST of 52 and an ALT of 49. She was recently started on Zocor, and she states her liver function tests were normal. Prior to Zocor   GI Review of Systems    Reports abdominal pain, belching, chest pain, and  nausea.     Location of  Abdominal pain: generalized.    Denies acid reflux, bloating, dysphagia with liquids, dysphagia with solids, heartburn, loss of appetite, vomiting, vomiting blood, weight loss, and  weight gain.      Reports change in bowel habits and  diarrhea.     Denies anal fissure, black tarry stools, constipation, diverticulosis, fecal incontinence, heme positive stool, hemorrhoids, irritable bowel syndrome, jaundice, light color stool, liver problems, rectal bleeding, and  rectal pain.   Current Medications (verified): 1)  Flonase 50 Mcg/act Susp (Fluticasone Propionate) .... As Directed 2)  Atenolol 25 Mg Tabs (Atenolol) .... One Tablet By Mouth Two Times A Day 3)  Zocor 10 Mg Tabs (Simvastatin) .... One Tablet By Mouth Once Daily 4)  Levbid 0.375 Mg Xr12h-Tab (Hyoscyamine Sulfate) .... 1/2-1 Tablet By Mouth Two Times A Day As Needed For Abdominal Pain and Bloating. 5)  Vitamin D3 2000 Unit Caps (Cholecalciferol) .Marland Kitchen.. 1 Capsule By Mouth Two Times A Day 6)  Vitamin E 400 Unit Caps (Vitamin E) .Marland Kitchen.. 1 Capsule By Mouth Two Times A Day 7)   Fish Oil 1000 Mg Caps (Omega-3 Fatty Acids) .Marland Kitchen.. 1 Capsule By Mouth Two Times A Day 8)  Alprazolam 0.5 Mg Tabs (Alprazolam) .Marland Kitchen.. 1 Tablet By Mouth As Needed  Allergies (verified): 1)  ! Prednisone  Past History:  Past Medical History: Reviewed history from 01/06/2010 and no changes required. Anal Fissure Anxiety Disorder Depression Irritable Bowel Syndrome Plantar fasciitis Allergies Palpitations with symptomatic PVC's concussion secondary to a accident  Past Surgical History: Reviewed history from 01/06/2010 and no changes required. Bunionectomy Anal Fissure Surgery  Adenoidectomy  Family History: Reviewed history from 01/06/2010 and no changes required. Family History of Colon Cancer:Mother at age 71.   Family History of Crohn's: Sister  Family History of Diabetes: Mom and Dad  Family History of Heart Disease: Mom and Dad Family History of Kidney Disease:Mother  Barrett's Esophagus-mother  Social History: Reviewed history from 12/22/2009 and no changes required. 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  Alcohol Use - no Illicit Drug Use - no  Review of Systems       The patient complains of back pain, night sweats, and sleeping problems.         The pertinent positives and negatives are noted as above and in the HPI. All other ROS were reviewed and were negative.   Vital Signs:  Patient profile:   45 year old female Height:      65 inches Weight:      237 pounds BMI:     39.58 Pulse rate:   80 /  minute Pulse rhythm:   regular BP sitting:   110 / 80  (left arm)  Vitals Entered By: Milford Cage NCMA (January 10, 2010 8:58 AM)  Physical Exam  General:  Well developed, well nourished, no acute distress. Head:  Normocephalic and atraumatic. Eyes:  PERRLA, no icterus. Mouth:  No deformity or lesions, dentition normal. Lungs:  Clear throughout to auscultation. Heart:  Regular rate and rhythm; no murmurs, rubs,  or  bruits. Abdomen:  Soft, nontender and nondistended. No masses, hepatosplenomegaly or hernias noted. Normal bowel sounds. Psych:  Alert and cooperative. Normal mood and affect.  Impression & Recommendations:  Problem # 1:  ABDOMINAL PAIN-MULTIPLE SITES (ICD-789.09) The majority of her symptoms are from irritable bowel syndrome. Since she does have episodic right upper quadrant pain in addition to generalized pain we will further evaluate with upper endoscopy and abdominal ultrasound imaging. If the abdominal ultrasound is negative, consider a CCK HIDA.  Problem # 2:  ABNORMAL FINDINGS GI TRACT (ICD-793.4) Mild esophageal wall thickening on CT. Likely related to GERD or under distention of the esophagus. Rule out other disorders. The risks, benefits and alternatives to endoscopy with possible biopsy and possible dilation were discussed with the patient and they consent to proceed. The procedure will be scheduled electively. Orders: EGD (EGD)  Problem # 3:  IRRITABLE BOWEL SYNDROME (ICD-564.1) She is reassured that her symptoms are all likely related to irritable bowel syndrome. She has had side effects from glycopyrrolate and no response to Bentyl. She has not yet tried Levbid. I advised her to take one half tablet twice a day see if that will manage her mild abdominal pain and bloating. She is very concerned about other underlying gastrointestinal disorders and anxiety appears to be a factor in her symptom complex.  Problem # 4:  ANXIETY (ICD-300.00) Further management with Dr. Lenord Fellers. Consider SSRI or other agents for long-term management.  Problem # 5:  TRANSAMINASES, SERUM, ELEVATED (ICD-790.4) Mildly elevated transaminases since beginning Zocor. I suspect this is related to Zocor. Liver function test monitoring per her primary physician. Schedule abdominal ultrasound. Consider further evaluation if the liver function tests worsen or do not resolve off Zocor.  Other Orders: Ultrasound  Abdomen (UAS)  Patient Instructions: 1)  Upper Endoscopy brochure given.  2)  You have been scheduled for a abdominal ultrasound.  3)  Copy sent to : Sharlet Salina, MD 4)  The medication list was reviewed and reconciled.  All changed / newly prescribed medications were explained.  A complete medication list was provided to the patient / caregiver.

## 2010-12-19 NOTE — Progress Notes (Signed)
Summary: Triage   Phone Note Call from Patient Call back at Work Phone [phone]   Caller: Patient Call For: Dr. Russella Dar Reason for Call: Talk to Nurse Summary of Call: Pt. was prescribed Omeprazole and is lactose tolerance and is taking Prilosec. Can she take 1 or 2 tablets of Prilosec? Initial call taken by: Karna Christmas,  February 08, 2010 10:22 AM  Follow-up for Phone Call        Pt states that she has read the ingrediants for omeprazole and it contains lactose and pt states she is lactose intolerant. Pt states the medicine has been bothering her and she wasn't sure why at first. She states she did research and the prilosec OTC does not contain milk and she has started taking that instead. She wants to know if that is ok and how many pills she should take since it is a different miligram. I told her that was fine but to take 2 tablet by mouth once daily in the morning and to call back if this does not help. Pt agreed.  Follow-up by: Christie Nottingham CMA Duncan Dull),  February 08, 2010 11:35 AM

## 2010-12-19 NOTE — Procedures (Signed)
Summary: Upper Endoscopy  Patient: Alejandra Reed Note: All result statuses are Final unless otherwise noted.  Tests: (1) Upper Endoscopy (EGD)   EGD Upper Endoscopy       DONE      [facility]     [address]     [place], [place]  [address]           ENDOSCOPY PROCEDURE REPORT           PATIENT:  Alejandra Reed, Alejandra Reed  MR#:  [id number]     BIRTHDATE:  [birth year], 43 yrs. old  GENDER:  female           ENDOSCOPIST:  Judie Petit T. Russella Dar, MD, [facility]           PROCEDURE DATE:  01/26/2010     PROCEDURE:  EGD, diagnostic     ASA CLASS:  Class II     INDICATIONS:  abdominal pain, right upper quad, abnormal     imaging-CT showing esophageal wall thickening.           MEDICATIONS:  Fentanyl 75 mcg IV, Versed 7 mg IV, Benadryl 25 mg     IV     TOPICAL ANESTHETIC:  Exactacain Spray           DESCRIPTION OF PROCEDURE:   After the risks benefits and     alternatives of the procedure were thoroughly explained, informed     consent was obtained.  The LB [id number] endoscope was     introduced through the mouth and advanced to the second portion of     the duodenum, without limitations.  The instrument was slowly     withdrawn as the mucosa was fully examined.     <<PROCEDUREIMAGES>>           Esophagitis was found in the distal esophagus. It was linear     arrayed and erosive. LA Classification Grade B. The stomach was     entered and closely examined. The pylorus, antrum, angularis, and     lesser curvature were well visualized, including a retroflexed     view of the cardia and fundus. The stomach wall was normally     distensable. There was minimal focal erythema in the gastric     antrum. The scope passed easily through the pylorus into the     duodenum. The duodenal bulb was normal in appearance, as was the     postbulbar duodenum. Retroflexed views revealed a hiatal hernia,     small. The scope was then withdrawn from the patient and the     procedure completed.        COMPLICATIONS:  None           ENDOSCOPIC IMPRESSION:     1) Erosive esophagitis     2) Small hiatal hernia           RECOMMENDATIONS:     1) Anti-reflux regimen     2) PPI qam: omeprazole 40mg  po qam, #30, 11 refills     3) OP follow-up in 1 year           Jorey Dollard T. Russella Dar, MD, [facility]           CC:  Sharlet Salina, MD           n.     Rosalie DoctorVenita Lick. Krishna Dancel at 01/26/2010 03:27 PM           Valentina Gu, [id number]  Note: An exclamation mark Marland Kitchen)  indicates a result that was not dispersed into the flowsheet. Document Creation Date: 01/26/2010 3:27 PM _______________________________________________________________________  (1) Order result status: Final Collection or observation date-time: 01/26/2010 15:21 Requested date-time:  Receipt date-time:  Reported date-time:  Referring Physician:   Ordering Physician: Claudette Head [phone]) Specimen Source:  Source: Launa Grill Order Number: [id number] Lab site:

## 2010-12-21 ENCOUNTER — Ambulatory Visit: Admit: 2010-12-21 | Payer: Self-pay

## 2010-12-21 ENCOUNTER — Ambulatory Visit (INDEPENDENT_AMBULATORY_CARE_PROVIDER_SITE_OTHER): Payer: BC Managed Care – PPO | Admitting: Cardiology

## 2010-12-21 ENCOUNTER — Encounter: Payer: Self-pay | Admitting: Cardiology

## 2010-12-21 DIAGNOSIS — E663 Overweight: Secondary | ICD-10-CM

## 2010-12-21 DIAGNOSIS — R002 Palpitations: Secondary | ICD-10-CM

## 2010-12-21 HISTORY — DX: Palpitations: R00.2

## 2010-12-21 HISTORY — DX: Overweight: E66.3

## 2010-12-27 NOTE — Assessment & Plan Note (Signed)
Summary: 45yr rov for palps pfh/sch per TP call. tmj/ rs per pt call.gd...   Vital Signs:  Patient profile:   45 year old female Height:      65 inches Weight:      214 pounds BMI:     35.74 Pulse rate:   60 / minute Resp:     16 per minute BP sitting:   124 / 70  (right arm)  Vitals Entered By: Marrion Coy, CNA (December 21, 2010 3:19 PM)  Visit Type:  Follow-up Primary Provider:  Laurann Montana, MD  CC:  palpitations.  History of Present Illness: The patient presents for followup of her known coronary disease. Since I last saw her she has had no new cardiovascular complaints. She has actually lost 60 pounds through a special diet for treatment of irritable bowel syndrome. She has exercised almost every day. She rarely has palpitations. If she does these are not severe. She denies any chest pressure, neck or arm discomfort. She denies any palpitations, presyncope or syncope. She has no PND or orthopnea. She has no edema. She actually recently had his dose of beta blocker because of low blood pressure.  Current Medications (verified): 1)  Flonase 50 Mcg/act Susp (Fluticasone Propionate) .... As Directed 2)  Atenolol 25 Mg Tabs (Atenolol) .... 1/2 By Mouth Two Times A Day 3)  Vitamin D3 2000 Unit Caps (Cholecalciferol) .Marland Kitchen.. 1 Capsule By Mouth Two Times A Day 4)  Vitamin E 400 Unit Caps (Vitamin E) .Marland Kitchen.. 1 Capsule By Mouth Two Times A Day 5)  Fish Oil 1000 Mg Caps (Omega-3 Fatty Acids) .Marland Kitchen.. 1 Capsule By Mouth Two Times A Day 6)  Alprazolam 0.5 Mg Tabs (Alprazolam) .Marland Kitchen.. 1 Tablet By Mouth As Needed 7)  Nexium 40 Mg Cpdr (Esomeprazole Magnesium) .Marland Kitchen.. 1 By Mouth Qd 8)  Calcium 500 Mg Tabs (Calcium) .Marland Kitchen.. 1 By Mouth Daily 9)  Zyrtec Allergy 10 Mg Tabs (Cetirizine Hcl) .Marland Kitchen.. 1 By Mouth Daily  Allergies (verified): 1)  ! Prednisone  Past History:  Past Medical History: Anal Fissure Anxiety Disorder Depression Irritable Bowel Syndrome Plantar fasciitis Allergies Palpitations with  symptomatic PVC's Concussion secondary to a accident  Past Surgical History: Reviewed history from 01/06/2010 and no changes required. Bunionectomy Anal Fissure Surgery  Adenoidectomy  Review of Systems       As stated in the HPI and negative for all other systems.   Physical Exam  General:  Well developed, well nourished, in no acute distress. Head:  normocephalic and atraumatic Mouth:  Teeth, gums and palate normal. Oral mucosa normal. Neck:  Neck supple, no JVD. No masses, thyromegaly or abnormal cervical nodes. Chest Wall:  no deformities or breast masses noted Lungs:  Clear bilaterally to auscultation and percussion. Heart:  Non-displaced PMI, chest non-tender; regular rate and rhythm, S1, S2 without murmurs, rubs or gallops. Carotid upstroke normal, no bruit. Normal abdominal aortic size, no bruits. Femorals normal pulses, no bruits. Pedals normal pulses. No edema, no varicosities. Abdomen:  Bowel sounds positive; abdomen soft and non-tender without masses, organomegaly, or hernias noted. No hepatosplenomegaly. Msk:  Back normal, normal gait. Muscle strength and tone normal. Extremities:  No clubbing or cyanosis. Neurologic:  Alert and oriented x 3. Skin:  Intact without lesions or rashes. Cervical Nodes:  no significant adenopathy Inguinal Nodes:  no significant adenopathy Psych:  Normal affect.   Impression & Recommendations:  Problem # 1:  PALPITATIONS (ICD-785.1) EKG with sinus rhythm premature ectopic complexes (12/21/10).  However, these  palpitations are not symptomatic. No change in therapy is indicated. We discussed symptomatic treatment. Her updated medication list for this problem includes:    Atenolol 25 Mg Tabs (Atenolol) .Marland Kitchen... 1/2 by mouth two times a day  Orders: EKG w/ Interpretation (93000)  Problem # 2:  OVERWEIGHT (ICD-278.02) I am delighted by her weight loss. She will continue on the same.  Patient Instructions: 1)  Your physician recommends that  you schedule a follow-up appointment in: 2 yrs with Dr Antoine Poche 2)  Your physician recommends that you continue on your current medications as directed. Please refer to the Current Medication list given to you today. Prescriptions: ATENOLOL 25 MG TABS (ATENOLOL) 1/2 by mouth two times a day  #90 x 3   Entered by:   Charolotte Capuchin, RN   Authorized by:   Rollene Rotunda, MD, [facility]   Signed by:   Charolotte Capuchin, RN on 12/21/2010   Method used:   Electronically to        [facility]. [phone]* (retail)       [address]       [place], [place]  [address]       Ph: [phone]       Fax: [phone]   RxID:   [id number]    Orders Added: 1)  EKG w/ Interpretation [93000]

## 2011-02-27 ENCOUNTER — Other Ambulatory Visit: Payer: Self-pay | Admitting: Family Medicine

## 2011-02-27 DIAGNOSIS — Z1231 Encounter for screening mammogram for malignant neoplasm of breast: Secondary | ICD-10-CM

## 2011-04-02 ENCOUNTER — Ambulatory Visit
Admission: RE | Admit: 2011-04-02 | Discharge: 2011-04-02 | Disposition: A | Discharge status: Home / Self Care | Payer: BC Managed Care – PPO | Source: Ambulatory Visit | Attending: Family Medicine | Admitting: Family Medicine

## 2011-04-02 DIAGNOSIS — Z1231 Encounter for screening mammogram for malignant neoplasm of breast: Secondary | ICD-10-CM

## 2011-04-03 NOTE — Assessment & Plan Note (Signed)
[facility]                            CARDIOLOGY OFFICE NOTE   Alejandra Reed, Alejandra Reed                        MRN:          [id number]  DATE:12/10/2008                            DOB:          1966-11-15    PRIMARY CARE PHYSICIAN:  Luanna Cole. Baxley, MD   REASON FOR PRESENTATION:  Evaluate the patient with palpitations and  chest pain.   HISTORY OF PRESENT ILLNESS:  The patient returns after an almost 2 year  absence.  She called to get renewal on her atenolol and was scheduled  for followup.  She has actually been doing well.  She has been having  rare palpitations much better than in the past.  She has not been having  any chest discomfort.  She thinks a lot of it was related to stress of  death of her father and tying up his affairs.  She was also having  stress at work.  She was treated with antidepressant for a short while.  She is not taking St. John's wort.  She says with all of these she has  done better.  She is not having any palpitation, presyncope, or syncope.  She is not having any chest pain or shortness of breath.  She has gained  some weight, but has started exercising again.  Her lipids are slightly  elevated, but again is followed up closely by Dr. Lenord Fellers.   PAST MEDICAL HISTORY:  1. Concussion.  2. Palpitations.  3. Plantar fasciitis.  4. Status post anal fissure repair.  5. Adenoidectomy.  6. Bunion removal.   ALLERGIES:  PREDNISONE.   MEDICATIONS:  1. Atenolol 25 mg b.i.d.  2. Calcium.  3. Vitamin D.  4. Ativan.  5. Vitamin A.  6. Flaxseed oil.  7. Fish oil.  8. St. John's wort.   REVIEW OF SYSTEMS:  As stated in the HPI and otherwise negative for  other systems.   PHYSICAL EXAMINATION:  GENERAL:  The patient is in no distress.  VITAL SIGNS:  Blood pressure 111/78, heart 73 and regular, weight 274  pounds, and body mass index 45.5.  NECK:  No jugular venous distension at 45 degrees.  Carotid upstroke  brisk and  symmetric.  No bruits.  No thyromegaly.  LYMPHATICS:  No adenopathy.  LUNGS:  Clear to auscultation bilaterally.  BACK:  No costovertebral angle tenderness.  CHEST:  Unremarkable.  HEART:  PMI not displaced or sustained.  S1 and S2 within normal limits.  No S3, no S4.  No clicks, no rubs, no murmurs.  ABDOMEN:  Obese, positive bowel sounds, normal in frequency and pitch.  No bruits, no rebound, no guarding.  No midline pulsatile mass.  No  organomegaly.  SKIN:  No rashes.  No nodules.  EXTREMITIES:  Pulses 2+.  No edema.   ASSESSMENT AND PLAN:  1. Palpitations.  These are well controlled with stress reduction and      with the atenolol.  She wants to continue this.  Therefore, we will      write a prescription.  No further evaluation is warranted.  2.  Obesity.  We discussed weight loss with diet and exercise and I      applauded her efforts, start exercising, and count calories.  3. Risk reduction.  The patient does have a family history of early      heart disease.  She cannot exercise.  She is going to watch her      weight.  She is having her lipids followed closely by Dr. Lenord Fellers      and I would have a low threshold to suggest statin if she does not      have an LDL less than 100 or an HDL greater than 50.     Rollene Rotunda, MD, [facility]  Electronically Signed    JH/MedQ  DD: 12/10/2008  DT: 12/11/2008  Job #: [id number]   cc:   Luanna Cole. Lenord Fellers, M.D.

## 2011-04-06 NOTE — Op Note (Signed)
NAMESHELLY, Reed               ACCOUNT NO.:  [id number]   MEDICAL RECORD NO.:  [id number]          PATIENT TYPE:  AMB   LOCATION:  DSC                          FACILITY:  [facility]   PHYSICIAN:  Currie Paris, M.D.DATE OF BIRTH:  May 09, 1966   DATE OF PROCEDURE:  09/25/2005  DATE OF DISCHARGE:                                 OPERATIVE REPORT   OFFICE MEDICAL RECORD NUMBER:  [id number]   PREOPERATIVE DIAGNOSIS:  Fistula in ano with seton.   POSTOPERATIVE DIAGNOSIS:  Fistula in ano with seton.   OPERATION:  Partial anal fistulotomy with seton replacement (staged  procedure).   SURGEON:  Currie Paris, M.D.   ANESTHESIA:  General.   CLINICAL HISTORY:  This is a 39-year lady who presented with a fistula in  ano.  At her initial surgical intervention, it was found that the internal  opening was quite high and the external portion of the fistula was opened up  and a seton placed.  She has tolerated that well and the external portion is  starting to granulate over and heal up.  She is brought back to the  operating room to see if we could not shorten the tract a little bit and  replace the seton.   DESCRIPTION OF PROCEDURE:  The patient was seen in the holding area and she  had no further questions.  She was taken to the operating room and after  satisfactory general anesthesia obtained, was placed in the lithotomy  position and the perianal area prepped and draped.  The time-out occurred.   The fistula was in the left anterior aspect.  I placed an anoscope and  injected the area with some a 0.25% plain Marcaine with epinephrine.  I used  a sponge to rough up the excess granulations and cauterized that.  I also  tried to run a sponge down the fistula tract to clean out some of the  granulations and any epithelialization that might have been occurring.  I  had actually placed 2 sutures previously, so I used 1 of those and tied new  a 0 silk to that and used that to pull  the new silk through the fistula  tract.  The old suture was cut out and the new suture tied down snugly to  start cut through a little bit more of the fistula tract.  Everything  appeared to be dry.   My estimation is she will need at least 1 more trip to the operating room  for a little bit more work on this and possible placement of seton or if  possible, more fistulotomy, depending on how much sphincter muscle was left,  but not cut through.      Currie Paris, M.D.  Electronically Signed     CJS/MEDQ  D:  09/25/2005  T:  09/25/2005  Job:  [id number]   cc:   Luanna Cole. Lenord Fellers, M.D.  Fax: [phone]

## 2011-04-06 NOTE — Assessment & Plan Note (Signed)
[facility]                            CARDIOLOGY OFFICE NOTE   NATALIE, MCEUEN                        MRN:          [id number]  DATE:02/03/2007                            DOB:          12-10-1965    PRIMARY CARE PHYSICIAN:  Dr. Eden Emms Baxley.   REASON FOR PRESENTATION:  Evaluate patient with chest pain.   HISTORY OF PRESENT ILLNESS:  Patient is a pleasant 45 year old white  female with past history of palpitations. She has also had chest pain  and in 2005 had a negative exercise treadmill test. She was in the  emergency room on Friday with chest discomfort. This started that day.  It was 3 to 4 out of 10. It would come and go. It was a dull substernal  discomfort. It lasted from 8 in the morning to 5 p.m. It was occurring  at rest. It was not radiating to her jaw or to her arms. There was no  associated nausea, vomiting, or diaphoresis. It went away in the evening  in the emergency room, sometime after getting some Ativan. She has had a  fleeting recurrence of this on Sunday. In the emergency room she had  negative cardiac enzymes and no abnormalities on her EKG. This was  unlike any discomfort she had had before.   The patient is not particularly active. She walks at work. She climbs  stairs. With this she denies any chest discomfort, neck discomfort, arm  discomfort, activity induced nausea, vomiting, excessive diaphoresis.  She has had no palpitations, presyncope, or syncope. She denies any PND  or orthopnea.   PAST MEDICAL HISTORY:  Concussion, palpitations, plantar fascitis.   PAST SURGICAL HISTORY:  Status post anal fistula repair, adenoidectomy,  bunion removal.   ALLERGIES:  PREDNISONE.   MEDICATIONS:  1. Atenolol 25 mg b.i.d.  2. Calcium.  3. Vitamin D.  4. Ativan.  5. Vitamin A.  6. Vitamin E.  7. Flax seed oil.   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 She  is not married and has no  children. She has a dog.   FAMILY HISTORY:  Contributory for her father having coronary disease at  57 and dying last fall 2 years after bypass.   REVIEW OF SYSTEMS:  As stated in the HPI, positive for plantar fascitis  and pain in her foot with walking. Otherwise negative for other systems.   PHYSICAL EXAMINATION:  Patient is in no distress. Blood pressure 129/90,  heart rate 76 and regular, weight 260 pounds, body mass index 42.  HEENT: Eyes unremarkable, pupils equal, round, and reactive to light,  fundi not visualized, oral mucosa unremarkable.  NECK: No jugular venous distension at 45 degrees, carotid upstrokes  symmetric and brisk, no bruits. No thyromegaly.  LYMPHATICS: No cervical, axillary, inguinal adenopathy.  LUNGS: Clear to auscultation bilaterally.  BACK: No costovertebral angle tenderness.  CHEST: Unremarkable.  HEART: PMI not displaced or sustained, S1, and S2 within normal limits.  No S3, no S4, no clicks, rubs, or murmurs.  ABDOMEN: Obese, positive bowel  sounds normal to frequency and pitch. No  bruits, rebound, no midline pulsatile mass, no hepatomegaly,  splenomegaly.  SKIN: No rashes, no nodules .  EXTREMITIES: 2+  pulses throughout, no edema, cyanosis, clubbing.  NEURO: Oriented to person, place, and time. Cranial nerves II-XII  grossly intact, motor grossly intact.   EKG: Sinus rhythm, rate 76, poor anterior R wave progression, not acute  ST-T wave changes, nonspecific inferior T wave changes.   1. Chest:  Patient's chest is very atypical. She had no high risk      features and no objective evidence of ischemia on her emergency      room visit. She has had a negative stress test in 2005. She has      minimal cardiovascular risk factors. Given this the pretest      probability of obstructive coronary disease as the etiology of her      symptoms is very low and does not justify further testing. She and      I discussed this.  2. Anxiety, this may be playing  a role. She has had a lot happening in      her life in the last year. I asked her to talk with Dr. Lenord Fellers      about continuing antianxiety medications or perhaps treatment for      panic which she thinks she has had in the past year.  3. Follow up, she can come back to this clinic as needed.     Rollene Rotunda, MD, [facility]  Electronically Signed    JH/MedQ  DD: 02/03/2007  DT: 02/03/2007  Job #: [id number]   cc:   Luanna Cole. Lenord Fellers, M.D.

## 2011-04-06 NOTE — Assessment & Plan Note (Signed)
[facility]                           GASTROENTEROLOGY OFFICE NOTE   Alejandra Reed, Alejandra Reed                        MRN:          [id number]  DATE:09/12/2006                            DOB:          Mar 29, 1966    REASON FOR REFERRAL:  Diarrhea and left lower quadrant pain.   HISTORY OF PRESENT ILLNESS:  Alejandra Reed is a 45 year old white female,  referred through the courtesy of Dr. Eden Emms Baxley.  She has a history of  fistula in ano, and has undergone fistulotomy x2 by Dr. Jamey Ripa in 2006.  She  has noticed that since her surgery, when she has loose or urgent bowel  movements, she has a difficult time holding onto the bowel movement to get  to the bathroom.  She recently had problems with watery stools occurring  about twice daily.  These symptoms began after beginning an anti-  inflammatory for plantar fasciitis, and after she discontinued the anti-  inflammatory her diarrhea has abated.  She notes no rectal bleeding or  change in stool caliber.  She has noted left flank and left lower quadrant  pain that has been waxing and waning for about the past 6 weeks.  The pain  seems to be more bothersome when she has not had a bowel movement for a day  or two.  She has had intermittent problems with heartburn and indigestion  occurring maybe 1 to 3 times a month.  She was recently given samples of  Protonix, which she is now taking on a p.r.n. basis with excellent on-demand  control of her reflux symptoms.  She has no dysphagia, odynophagia, weight  loss, fevers or chills.  She denies any recent antibiotic usage.  Her mother  developed colon cancer at age 63, and she has a 23 year old sister with  Crohn's disease and colon polyps.  No other family members with colon  cancer, colon polyps or inflammatory bowel disease.   PAST MEDICAL HISTORY:  1. Status post fistulotomy x2 for a fistula in ano.  2. Allergies.  3. Plantar fasciitis.  4. Palpitations with  symptomatic PVCs.  5. History of a concussion secondary to an accident.  6. Status post adenoidectomy.  7. Status post bunion removal.   CURRENT MEDICATIONS:  1. Atenolol 25 mg p.o. b.i.d.  2. Calcium b.i.d.  3. Vitamin B daily.  4. Protonix daily p.r.n.   MEDICATION ALLERGIES:  PREDNISONE.   Social history and review of systems per the handwritten form.   PHYSICAL EXAMINATION:  Obese white female in no acute distress.  Height 5  feet 6 inches, weight 267 pounds.  Blood pressure is 110/80, pulse 62 and  regular.  HEENT:  Anicteric sclerae.  Oropharynx is clear.  CHEST:  Clear to auscultation bilaterally.  CARDIAC:  Regular rate and rhythm without murmurs appreciated.  ABDOMEN:  Large, soft and nondistended.  Minimal left lower quadrant  tenderness to deep palpation, no rebound or guarding, no palpable  organomegaly, masses or hernias.  Normal active bowel sounds.  RECTAL:  Examination deferred to time of colonoscopy.  EXTREMITIES:  Without clubbing, cyanosis  or edema.  NEUROLOGIC:  Alert and oriented x3.  Grossly nonfocal.   LABORATORY DATA:  Abdominal and pelvic CT scan from August 29, 2006, was  entirely negative.  CBC, CMET and erythrocyte sedimentation rate from  August 19, 2006, all normal.   ASSESSMENT AND PLAN:  1. Rule out irritable bowel syndrome, inflammatory bowel disease and      colorectal neoplasms.  She appears to have had a side effect of      diarrhea from her recent anti-inflammatory, she will remain off this      medication.  Trial of Levsin 1 to 2 sublingually every 4 hours as      needed for left lower quadrant pain.  Risks, benefits and alternatives      to colonoscopy with possible biopsy and possible polypectomy discussed      with the patient, and she consents to proceed.  This will be scheduled      electively.  2. Intermittent fecal incontinence.  I suspect this is related to her      fistulotomies; however, further evaluation with colonoscopy is  planned      as above.  She may need further followup with Dr. Jamey Ripa.     Venita Lick. Russella Dar, MD, [facility]    MTS/MedQ  DD: 09/12/2006  DT: 09/13/2006  Job #: [id number]   cc:   Luanna Cole. Lenord Fellers, M.D.

## 2011-04-06 NOTE — Op Note (Signed)
NAMEKEYOSHA, TIEDT               ACCOUNT NO.:  [id number]   MEDICAL RECORD NO.:  [id number]          PATIENT TYPE:  AMB   LOCATION:  DSC                          FACILITY:  [facility]   PHYSICIAN:  Currie Paris, M.D.DATE OF BIRTH:  07/16/1966   DATE OF PROCEDURE:  09/05/2005  DATE OF DISCHARGE:                                 OPERATIVE REPORT   PREOP DIAGNOSIS:  Fistula in ano.   POSTOPERATIVE DIAGNOSIS:  Fistula in ano.   OPERATION:  Rectal examination under anesthesia with partial fistulotomy and  placement ofseton.   SETON.SURGEON:  Dr. Jamey Ripa   ANESTHESIA:  General.   CLINICAL HISTORY:  This a 45 year old with a left anterior fistula in ano.   DESCRIPTION OF PROCEDURE:  The patient seen in the holding area and had no  further questions. I were reviewed the plans for fistulotomy and possible  placement of seton.   She is taken to operating room and after satisfactory general anesthesia had  been obtained was placed in the lithotomy position. Perianal area was  prepped and draped.   The time-out occurred.   The anoscope was introduced and I was able to readily place a probe from the  external opening which was left anterior and actually just a little bit  anterior to the posterior aspect of the vaginal opening. This went through a  very long tract and entered the rectum in a straight line radial fashion. It  had entered about 3 cm above the dentate line and the entire length of the  fistula tract was at least 5 cm. It went clearly well above the sphincter  muscles.   With the probe in place I did a partial fistulotomy starting at the external  opening and working until I got to the level of the internal sphincter where  I could visualize that. We opened it for this length which was about 2.5 cm.  I used the rough edge of a 4x4 to try to scrape out the chronic granulations  both from the area where I had opened the fistula and down along the tract.   I thought that  there would be no reason to further open this today as I  thought that cutting the entire sphincter with leave [place] with a high chance  for incontinence. I therefore passed two 0 silk sutures through the fistula  tract using the probe.  One was tied loosely and one snugly over the fistula  tract to act as a seton.   At this point, will plan to have her do local wound care and see her back in  the office.  I will plan repeat exam and possible partial sphincterotomy and  fistulotomy in about 3 weeks.      Currie Paris, M.D.  Electronically Signed     CJS/MEDQ  D:  09/05/2005  T:  09/05/2005  Job:  [id number]   cc:   Luanna Cole. Lenord Fellers, M.D.  Fax: [phone]

## 2011-07-05 ENCOUNTER — Ambulatory Visit (HOSPITAL_COMMUNITY)
Admission: RE | Admit: 2011-07-05 | Discharge: 2011-07-05 | Disposition: A | Discharge status: Home / Self Care | Payer: BC Managed Care – PPO | Source: Ambulatory Visit | Attending: Family Medicine | Admitting: Family Medicine

## 2011-07-05 DIAGNOSIS — M7989 Other specified soft tissue disorders: Secondary | ICD-10-CM | POA: Insufficient documentation

## 2011-07-05 DIAGNOSIS — M79609 Pain in unspecified limb: Secondary | ICD-10-CM | POA: Insufficient documentation

## 2011-12-24 ENCOUNTER — Other Ambulatory Visit: Payer: Self-pay | Admitting: Cardiology

## 2012-03-10 ENCOUNTER — Other Ambulatory Visit: Payer: Self-pay | Admitting: Family Medicine

## 2012-03-10 DIAGNOSIS — Z1231 Encounter for screening mammogram for malignant neoplasm of breast: Secondary | ICD-10-CM

## 2012-04-02 ENCOUNTER — Ambulatory Visit
Admission: RE | Admit: 2012-04-02 | Discharge: 2012-04-02 | Disposition: A | Discharge status: Home / Self Care | Payer: BC Managed Care – PPO | Source: Ambulatory Visit | Attending: Family Medicine | Admitting: Family Medicine

## 2012-04-02 DIAGNOSIS — Z1231 Encounter for screening mammogram for malignant neoplasm of breast: Secondary | ICD-10-CM

## 2012-07-22 ENCOUNTER — Other Ambulatory Visit: Payer: Self-pay | Admitting: Cardiology

## 2012-07-23 NOTE — Telephone Encounter (Signed)
..   Requested Prescriptions   Pending Prescriptions Disp Refills  . atenolol (TENORMIN) 25 MG tablet [Pharmacy Med Name: ATENOLOL 25MG  TABLETS] 90 tablet 2    Sig: TAKE  1/2 TABLET BY MOUTH TWICE DAILY

## 2012-12-26 ENCOUNTER — Encounter: Payer: Self-pay | Admitting: Cardiology

## 2012-12-26 ENCOUNTER — Ambulatory Visit (INDEPENDENT_AMBULATORY_CARE_PROVIDER_SITE_OTHER): Payer: BC Managed Care – PPO | Admitting: Cardiology

## 2012-12-26 VITALS — BP 110/74 | HR 77 | Ht 65.0 in | Wt 190.6 lb

## 2012-12-26 DIAGNOSIS — R002 Palpitations: Secondary | ICD-10-CM

## 2012-12-26 DIAGNOSIS — I10 Essential (primary) hypertension: Secondary | ICD-10-CM

## 2012-12-26 NOTE — Patient Instructions (Addendum)
The current medical regimen is effective;  continue present plan and medications.  Follow up as needed 

## 2012-12-26 NOTE — Progress Notes (Signed)
   HPI  The patient presents for followup of palpitations. Over the last couple of years she has none well. She rarely gets palpitations. She doesn't have any presyncope or syncope. She has no chest pressure, neck or arm discomfort. She's had no weight gain or edema. She exercises routinely. She did have foot surgery recently which sidelined her for a little while.  Allergies  Allergen Reactions  . Prednisone     Current Outpatient Prescriptions  Medication Sig Dispense Refill  . atenolol (TENORMIN) 25 MG tablet TAKE  1/2 TABLET BY MOUTH TWICE DAILY  90 tablet  2    Past Medical History  Diagnosis Date  . TRANSAMINASES, SERUM, ELEVATED 01/10/2010    Qualifier: Diagnosis of  By: Russella Dar MD Bronson Curb T   . RLQ PAIN 12/22/2009    Qualifier: Diagnosis of  By: Dorian Pod, Elita Quick    . Palpitations 12/21/2010    Qualifier: Diagnosis of  By: Antoine Poche MD, Gerrit Heck    . Overweight 12/21/2010    Qualifier: Diagnosis of  By: Antoine Poche, MD, Gerrit Heck    . Irritable bowel syndrome 01/10/2010    Qualifier: Diagnosis of  By: Russella Dar MD Marylu Lund   . HYPERTENSION 12/22/2009    Qualifier: Diagnosis of  By: Monica Becton PA-c, Amy S   . Diarrhea 12/22/2009    Qualifier: Diagnosis of  By: Dorian Pod, Pam    . ABNORMAL FINDINGS GI TRACT 12/22/2009    Qualifier: Diagnosis of  By: Myrtie Hawk, Amy S   . ABDOMINAL PAIN-MULTIPLE SITES 01/10/2010    Qualifier: Diagnosis of  By: Russella Dar MD Marylu Lund     Past Surgical History  Procedure Date  . Bunionectomy     Both feet  . Plantar fascia release     Right  . Wisdom tooth extraction   . Adenoidectomy   . Treatment fistula anal       ROS:  As stated in the HPI and negative for all other systems.  PHYSICAL EXAM BP 110/74  Pulse 77  Ht 5\' 5"  (1.651 m)  Wt 190 lb 9.6 oz (86.456 kg)  BMI 31.72 kg/m2 GENERAL:  Well appearing HEENT:  Pupils equal round and reactive, fundi not visualized, oral mucosa unremarkable NECK:  No jugular venous  distention, waveform within normal limits, carotid upstroke brisk and symmetric, no bruits, no thyromegaly LYMPHATICS:  No cervical, inguinal adenopathy LUNGS:  Clear to auscultation bilaterally BACK:  No CVA tenderness CHEST:  Unremarkable HEART:  PMI not displaced or sustained,S1 and S2 within normal limits, no S3, no S4, no clicks, no rubs, no murmurs ABD:  Flat, positive bowel sounds normal in frequency in pitch, no bruits, no rebound, no guarding, no midline pulsatile mass, no hepatomegaly, no splenomegaly EXT:  2 plus pulses throughout, no edema, no cyanosis no clubbing SKIN:  No rashes no nodules NEURO:  Cranial nerves II through XII grossly intact, motor grossly intact throughout PSYCH:  Cognitively intact, oriented to person place and time  EKG:  Sinus rhythm, rate 77, axis within normal limits, intervals within normal limits, no acute ST-T wave changes.   ASSESSMENT AND PLAN  Palpitations - She rarely has these. She tolerates the medicines as listed. Therefore, no further cardiovascular testing is suggested. She can remain on the current dose of beta blocker.  HTN - The blood pressures well controlled.  She will continue the meds as listed and therapeutic lifestyle changes.

## 2012-12-29 ENCOUNTER — Other Ambulatory Visit: Payer: Self-pay | Admitting: *Deleted

## 2012-12-29 MED ORDER — ATENOLOL 25 MG PO TABS: ORAL_TABLET | ORAL | Status: DC

## 2013-03-03 ENCOUNTER — Other Ambulatory Visit: Payer: Self-pay

## 2013-03-03 DIAGNOSIS — Z1231 Encounter for screening mammogram for malignant neoplasm of breast: Secondary | ICD-10-CM

## 2013-04-03 ENCOUNTER — Ambulatory Visit
Admission: RE | Admit: 2013-04-03 | Discharge: 2013-04-03 | Disposition: A | Discharge status: Home / Self Care | Payer: BC Managed Care – PPO | Source: Ambulatory Visit

## 2013-04-03 DIAGNOSIS — Z1231 Encounter for screening mammogram for malignant neoplasm of breast: Secondary | ICD-10-CM

## 2013-08-09 ENCOUNTER — Other Ambulatory Visit: Payer: Self-pay | Admitting: Cardiology

## 2013-10-19 ENCOUNTER — Other Ambulatory Visit (HOSPITAL_COMMUNITY)
Admission: RE | Admit: 2013-10-19 | Discharge: 2013-10-19 | Disposition: A | Discharge status: Home / Self Care | Payer: BC Managed Care – PPO | Source: Ambulatory Visit | Attending: Family Medicine | Admitting: Family Medicine

## 2013-10-19 ENCOUNTER — Other Ambulatory Visit: Payer: Self-pay | Admitting: Family Medicine

## 2013-10-19 DIAGNOSIS — Z1151 Encounter for screening for human papillomavirus (HPV): Secondary | ICD-10-CM | POA: Insufficient documentation

## 2013-10-19 DIAGNOSIS — Z124 Encounter for screening for malignant neoplasm of cervix: Secondary | ICD-10-CM | POA: Insufficient documentation

## 2014-03-11 ENCOUNTER — Encounter: Payer: Self-pay | Admitting: Podiatry

## 2014-03-11 ENCOUNTER — Ambulatory Visit (INDEPENDENT_AMBULATORY_CARE_PROVIDER_SITE_OTHER): Payer: BC Managed Care – PPO | Admitting: Podiatry

## 2014-03-11 ENCOUNTER — Ambulatory Visit (INDEPENDENT_AMBULATORY_CARE_PROVIDER_SITE_OTHER): Payer: BC Managed Care – PPO

## 2014-03-11 VITALS — BP 134/81 | HR 70 | Resp 17 | Ht 65.0 in | Wt 215.0 lb

## 2014-03-11 DIAGNOSIS — M779 Enthesopathy, unspecified: Secondary | ICD-10-CM

## 2014-03-11 DIAGNOSIS — M7750 Other enthesopathy of unspecified foot: Secondary | ICD-10-CM

## 2014-03-11 DIAGNOSIS — S93409A Sprain of unspecified ligament of unspecified ankle, initial encounter: Secondary | ICD-10-CM

## 2014-03-11 NOTE — Progress Notes (Signed)
Subjective:     Patient ID: Alejandra BuffaloPhyllis M Reed, female   DOB: 02-Apr-1966, 48 y.o.   MRN: [id number]  HPI patient states that I'm trying to increase my activity for my left foot is sore and making it difficult for me to walk. Does not remember specific injury   Review of Systems     Objective:   Physical Exam Neurovascular status intact with quite a bit of discomfort in the left ankle around the anterior tibial tendon and slightly medial. I did not note any dysfunction of the anterior tibial tendon itself    Assessment:     Probable tendinitis secondary to increased activity of the left anterior tibial tendon    Plan:     H&P and x-ray reviewed. Careful sheath injection administered 3 mg Kenalog 5 mg Xylocaine Marcaine mixture and advised her on reduced activity and ice therapy to the area. Reappoint her recheck

## 2014-03-11 NOTE — Progress Notes (Signed)
   Subjective:    Patient ID: Alejandra Reed, female    DOB: 31-Jul-1966, 48 y.o.   MRN: [id number]  HPI Comments: N swelling L left dorsal anterior foot D 2.5 weeks O after water aerobic C swell, and a dull ache A weightbearing, exercise walking and plantar flexion, going down steps T antiinflammatory cream, and ice and rest      Review of Systems  Cardiovascular:       Atenolol for cardiac arrhythmia  Gastrointestinal:       Irritable bowel syndrome  All other systems reviewed and are negative.      Objective:   Physical Exam        Assessment & Plan:

## 2014-03-26 ENCOUNTER — Other Ambulatory Visit: Payer: Self-pay

## 2014-03-26 DIAGNOSIS — Z1231 Encounter for screening mammogram for malignant neoplasm of breast: Secondary | ICD-10-CM

## 2014-04-15 ENCOUNTER — Other Ambulatory Visit: Payer: Self-pay | Admitting: Cardiology

## 2014-04-29 ENCOUNTER — Ambulatory Visit
Admission: RE | Admit: 2014-04-29 | Discharge: 2014-04-29 | Disposition: A | Discharge status: Home / Self Care | Payer: BC Managed Care – PPO | Source: Ambulatory Visit

## 2014-04-29 ENCOUNTER — Encounter (INDEPENDENT_AMBULATORY_CARE_PROVIDER_SITE_OTHER): Payer: Self-pay

## 2014-04-29 DIAGNOSIS — Z1231 Encounter for screening mammogram for malignant neoplasm of breast: Secondary | ICD-10-CM

## 2014-12-29 ENCOUNTER — Other Ambulatory Visit: Payer: Self-pay | Admitting: Cardiology

## 2014-12-31 ENCOUNTER — Encounter: Payer: Self-pay | Admitting: Gastroenterology

## 2015-01-13 ENCOUNTER — Encounter: Payer: Self-pay | Admitting: Gastroenterology

## 2015-03-02 ENCOUNTER — Ambulatory Visit (AMBULATORY_SURGERY_CENTER): Payer: Self-pay | Admitting: *Deleted

## 2015-03-02 VITALS — Ht 64.0 in | Wt 224.0 lb

## 2015-03-02 DIAGNOSIS — Z800 Family history of malignant neoplasm of digestive organs: Secondary | ICD-10-CM

## 2015-03-02 MED ORDER — NA SULFATE-K SULFATE-MG SULF 17.5-3.13-1.6 GM/177ML PO SOLN: 1.0000 | Freq: Once | ORAL | Status: DC

## 2015-03-02 NOTE — Progress Notes (Signed)
No egg. No anesthesia problems. Soy causes IBS to flare.  No home O2.  No diet meds.

## 2015-03-07 ENCOUNTER — Encounter: Payer: Self-pay | Admitting: Gastroenterology

## 2015-03-14 ENCOUNTER — Telehealth: Payer: Self-pay | Admitting: Gastroenterology

## 2015-03-14 NOTE — Telephone Encounter (Signed)
A user error has taken place.

## 2015-03-16 ENCOUNTER — Ambulatory Visit (AMBULATORY_SURGERY_CENTER): Payer: BLUE CROSS/BLUE SHIELD | Admitting: Gastroenterology

## 2015-03-16 ENCOUNTER — Encounter: Payer: Self-pay | Admitting: Gastroenterology

## 2015-03-16 VITALS — BP 101/61 | HR 67 | Temp 97.9°F | Resp 12 | Ht 64.0 in | Wt 224.0 lb

## 2015-03-16 DIAGNOSIS — Z800 Family history of malignant neoplasm of digestive organs: Secondary | ICD-10-CM

## 2015-03-16 DIAGNOSIS — Z1211 Encounter for screening for malignant neoplasm of colon: Secondary | ICD-10-CM

## 2015-03-16 MED ORDER — SODIUM CHLORIDE 0.9 % IV SOLN: 500.0000 mL | INTRAVENOUS | Status: DC

## 2015-03-16 NOTE — Progress Notes (Signed)
Report to PACU, RN, vss, BBS= Clear.  

## 2015-03-16 NOTE — Op Note (Signed)
[facility] [address] [place], [id number]   COLONOSCOPY PROCEDURE REPORT  PATIENT: Alejandra BuffaloYezek, [name]  MR#: [id number] BIRTHDATE: [birth year] , 49  yrs. old GENDER: female ENDOSCOPIST: Meryl DareMalcolm T Doral Digangi, MD, [facility] PROCEDURE DATE:  03/16/2015 PROCEDURE:   Colonoscopy, screening First Screening Colonoscopy - Avg.  risk and is 50 yrs.  old or older - No.  Prior Negative Screening - Now for repeat screening. Less than 10 yrs Prior Negative Screening - Now for repeat screening.  Above average risk  History of Adenoma - Now for follow-up colonoscopy & has been > or = to 3 yrs.  N/A ASA CLASS:   Class II INDICATIONS:Screening for colonic neoplasia and FH Colon or Rectal Adenocarcinoma. MEDICATIONS: Monitored anesthesia care and Propofol 300 mg IV DESCRIPTION OF PROCEDURE:   After the risks benefits and alternatives of the procedure were thoroughly explained, informed consent was obtained.  The digital rectal exam revealed no abnormalities of the rectum.   The LB [id number]  endoscope was introduced through the anus and advanced to the cecum, which was identified by both the appendix and ileocecal valve. No adverse events experienced.   The quality of the prep was excellent. (Suprep was used)  The instrument was then slowly withdrawn as the colon was fully examined.    COLON FINDINGS: A normal appearing cecum, ileocecal valve, and appendiceal orifice were identified.  The ascending, transverse, descending, sigmoid colon, and rectum appeared unremarkable. Retroflexed views revealed internal Grade I hemorrhoids. The time to cecum = 1.9 Withdrawal time = 7.1   The scope was withdrawn and the procedure completed. COMPLICATIONS: There were no immediate complications.  ENDOSCOPIC IMPRESSION: 1.  Normal colonoscopy 2.  Grade I internal hemorrhoids  RECOMMENDATIONS: 1.  Repeat Colonoscopy in 5 years.  eSigned:  Meryl DareMalcolm T Corbin Falck, MD, [facility] 03/16/2015 11:05 AM   cc:  Laurann Montanaynthia White, MD

## 2015-03-16 NOTE — Patient Instructions (Signed)
YOU HAD AN ENDOSCOPIC PROCEDURE TODAY AT THE [facility]:   Refer to the procedure report that was given to you for any specific questions about what was found during the examination.  If the procedure report does not answer your questions, please call your gastroenterologist to clarify.  If you requested that your care partner not be given the details of your procedure findings, then the procedure report has been included in a sealed envelope for you to review at your convenience later.  YOU SHOULD EXPECT: Some feelings of bloating in the abdomen. Passage of more gas than usual.  Walking can help get rid of the air that was put into your GI tract during the procedure and reduce the bloating. If you had a lower endoscopy (such as a colonoscopy or flexible sigmoidoscopy) you may notice spotting of blood in your stool or on the toilet paper. If you underwent a bowel prep for your procedure, you may not have a normal bowel movement for a few days.  Please Note:  You might notice some irritation and congestion in your nose or some drainage.  This is from the oxygen used during your procedure.  There is no need for concern and it should clear up in a day or so.  SYMPTOMS TO REPORT IMMEDIATELY:   Following lower endoscopy (colonoscopy or flexible sigmoidoscopy):  Excessive amounts of blood in the stool  Significant tenderness or worsening of abdominal pains  Swelling of the abdomen that is new, acute  Fever of 100F or higher  For urgent or emergent issues, a gastroenterologist can be reached at any hour by calling [phone].   DIET: Your first meal following the procedure should be a small meal and then it is ok to progress to your normal diet. Heavy or fried foods are harder to digest and may make you feel nauseous or bloated.  Likewise, meals heavy in dairy and vegetables can increase bloating.  Drink plenty of fluids but you should avoid alcoholic beverages for 24  hours.  ACTIVITY:  You should plan to take it easy for the rest of today and you should NOT DRIVE or use heavy machinery until tomorrow (because of the sedation medicines used during the test).    FOLLOW UP: Our staff will call the number listed on your records the next business day following your procedure to check on you and address any questions or concerns that you may have regarding the information given to you following your procedure. If we do not reach you, we will leave a message.  However, if you are feeling well and you are not experiencing any problems, there is no need to return our call.  We will assume that you have returned to your regular daily activities without incident.  If any biopsies were taken you will be contacted by phone or by letter within the next 1-3 weeks.  Please call us at [phone] if you have not heard about the biopsies in 3 weeks.    SIGNATURES/CONFIDENTIALITY: You and/or your care partner have signed paperwork which will be entered into your electronic medical record.  These signatures attest to the fact that that the information above on your After Visit Summary has been reviewed and is understood.  Full responsibility of the confidentiality of this discharge information lies with you and/or your care-partner..   Hemorrhoid information given.  Normal colonscopy, repeat in 5 years.

## 2015-03-17 ENCOUNTER — Telehealth: Payer: Self-pay | Admitting: *Deleted

## 2015-03-17 NOTE — Telephone Encounter (Signed)
  Follow up Call-  Call back number 03/16/2015  Post procedure Call Back phone  # [phone]  Permission to leave phone message Yes     Patient questions:  Do you have a fever, pain , or abdominal swelling? No. Pain Score  0 *  Have you tolerated food without any problems? Yes.    Have you been able to return to your normal activities? Yes.    Do you have any questions about your discharge instructions: Diet   No. Medications  No. Follow up visit  No.  Do you have questions or concerns about your Care? No.  Actions: * If pain score is 4 or above: No action needed, pain <4.

## 2015-03-29 ENCOUNTER — Other Ambulatory Visit: Payer: Self-pay

## 2015-03-29 DIAGNOSIS — Z1231 Encounter for screening mammogram for malignant neoplasm of breast: Secondary | ICD-10-CM

## 2015-05-03 ENCOUNTER — Ambulatory Visit
Admission: RE | Admit: 2015-05-03 | Discharge: 2015-05-03 | Disposition: A | Discharge status: Home / Self Care | Payer: BLUE CROSS/BLUE SHIELD | Source: Ambulatory Visit

## 2015-05-03 DIAGNOSIS — Z1231 Encounter for screening mammogram for malignant neoplasm of breast: Secondary | ICD-10-CM

## 2015-05-16 ENCOUNTER — Encounter: Payer: Self-pay | Admitting: Gastroenterology

## 2015-06-05 ENCOUNTER — Other Ambulatory Visit: Payer: Self-pay | Admitting: Cardiology

## 2015-06-06 NOTE — Telephone Encounter (Signed)
Patient has not been seen in over two years, but is prn follow up. Ok to refill or should this be deferred to pcp? Please advise. Thanks, MI

## 2015-06-08 ENCOUNTER — Other Ambulatory Visit: Payer: Self-pay

## 2015-06-08 MED ORDER — ATENOLOL 25 MG PO TABS: 12.5000 mg | ORAL_TABLET | Freq: Two times a day (BID) | ORAL | Status: DC

## 2015-06-08 NOTE — Telephone Encounter (Signed)
°  1. Which medications need to be refilled? Atenolol  2. Which pharmacy is medication to be sent to?[phone]  3. Do they need a 30 day or 90 day supply? 90 and refills  4. Would they like a call back once the medication has been sent to the pharmacy? no

## 2015-11-06 ENCOUNTER — Other Ambulatory Visit: Payer: Self-pay | Admitting: Cardiology

## 2015-11-08 NOTE — Telephone Encounter (Signed)
Approved      Disp Refills Start End    atenolol (TENORMIN) 25 MG tablet 90 tablet 1 06/08/2015     Sig - Route:  Take 0.5 tablets (12.5 mg total) by mouth 2 (two) times daily. - Oral    Class:  Normal    DAW:  No    Authorizing Provider:  Rollene RotundaJames Hochrein, MD    Ordering User:  Drue Dunhelsie S Lowe, CMA

## 2015-11-13 ENCOUNTER — Other Ambulatory Visit: Payer: Self-pay | Admitting: Cardiology

## 2016-03-19 DIAGNOSIS — M545 Low back pain: Secondary | ICD-10-CM | POA: Diagnosis not present

## 2016-03-19 DIAGNOSIS — M9904 Segmental and somatic dysfunction of sacral region: Secondary | ICD-10-CM | POA: Diagnosis not present

## 2016-03-19 DIAGNOSIS — M25511 Pain in right shoulder: Secondary | ICD-10-CM | POA: Diagnosis not present

## 2016-03-19 DIAGNOSIS — M9903 Segmental and somatic dysfunction of lumbar region: Secondary | ICD-10-CM | POA: Diagnosis not present

## 2016-03-27 ENCOUNTER — Other Ambulatory Visit: Payer: Self-pay

## 2016-03-27 DIAGNOSIS — Z1231 Encounter for screening mammogram for malignant neoplasm of breast: Secondary | ICD-10-CM

## 2016-04-20 DIAGNOSIS — M722 Plantar fascial fibromatosis: Secondary | ICD-10-CM | POA: Diagnosis not present

## 2016-04-20 DIAGNOSIS — M542 Cervicalgia: Secondary | ICD-10-CM | POA: Diagnosis not present

## 2016-04-20 DIAGNOSIS — M9901 Segmental and somatic dysfunction of cervical region: Secondary | ICD-10-CM | POA: Diagnosis not present

## 2016-04-20 DIAGNOSIS — M5127 Other intervertebral disc displacement, lumbosacral region: Secondary | ICD-10-CM | POA: Diagnosis not present

## 2016-04-24 DIAGNOSIS — F411 Generalized anxiety disorder: Secondary | ICD-10-CM | POA: Diagnosis not present

## 2016-04-24 DIAGNOSIS — Z6841 Body Mass Index (BMI) 40.0 and over, adult: Secondary | ICD-10-CM | POA: Diagnosis not present

## 2016-04-24 DIAGNOSIS — Z713 Dietary counseling and surveillance: Secondary | ICD-10-CM | POA: Diagnosis not present

## 2016-05-03 ENCOUNTER — Ambulatory Visit
Admission: RE | Admit: 2016-05-03 | Discharge: 2016-05-03 | Disposition: A | Discharge status: Home / Self Care | Payer: BLUE CROSS/BLUE SHIELD | Source: Ambulatory Visit

## 2016-05-03 DIAGNOSIS — Z1231 Encounter for screening mammogram for malignant neoplasm of breast: Secondary | ICD-10-CM

## 2016-05-29 DIAGNOSIS — M542 Cervicalgia: Secondary | ICD-10-CM | POA: Diagnosis not present

## 2016-05-29 DIAGNOSIS — M5127 Other intervertebral disc displacement, lumbosacral region: Secondary | ICD-10-CM | POA: Diagnosis not present

## 2016-05-29 DIAGNOSIS — M722 Plantar fascial fibromatosis: Secondary | ICD-10-CM | POA: Diagnosis not present

## 2016-05-29 DIAGNOSIS — M9901 Segmental and somatic dysfunction of cervical region: Secondary | ICD-10-CM | POA: Diagnosis not present

## 2016-07-20 DIAGNOSIS — M9901 Segmental and somatic dysfunction of cervical region: Secondary | ICD-10-CM | POA: Diagnosis not present

## 2016-07-20 DIAGNOSIS — M542 Cervicalgia: Secondary | ICD-10-CM | POA: Diagnosis not present

## 2016-07-20 DIAGNOSIS — M9903 Segmental and somatic dysfunction of lumbar region: Secondary | ICD-10-CM | POA: Diagnosis not present

## 2016-07-20 DIAGNOSIS — M722 Plantar fascial fibromatosis: Secondary | ICD-10-CM | POA: Diagnosis not present

## 2016-08-16 ENCOUNTER — Other Ambulatory Visit: Payer: Self-pay | Admitting: Cardiology

## 2016-08-16 NOTE — Telephone Encounter (Signed)
Rx request sent to pharmacy.  

## 2016-08-20 DIAGNOSIS — M9903 Segmental and somatic dysfunction of lumbar region: Secondary | ICD-10-CM | POA: Diagnosis not present

## 2016-08-20 DIAGNOSIS — M542 Cervicalgia: Secondary | ICD-10-CM | POA: Diagnosis not present

## 2016-08-20 DIAGNOSIS — M9901 Segmental and somatic dysfunction of cervical region: Secondary | ICD-10-CM | POA: Diagnosis not present

## 2016-08-20 DIAGNOSIS — M722 Plantar fascial fibromatosis: Secondary | ICD-10-CM | POA: Diagnosis not present

## 2016-08-27 DIAGNOSIS — M722 Plantar fascial fibromatosis: Secondary | ICD-10-CM | POA: Diagnosis not present

## 2016-08-27 DIAGNOSIS — M9903 Segmental and somatic dysfunction of lumbar region: Secondary | ICD-10-CM | POA: Diagnosis not present

## 2016-08-27 DIAGNOSIS — M9901 Segmental and somatic dysfunction of cervical region: Secondary | ICD-10-CM | POA: Diagnosis not present

## 2016-08-27 DIAGNOSIS — M542 Cervicalgia: Secondary | ICD-10-CM | POA: Diagnosis not present

## 2016-09-17 DIAGNOSIS — M722 Plantar fascial fibromatosis: Secondary | ICD-10-CM | POA: Diagnosis not present

## 2016-09-17 DIAGNOSIS — M542 Cervicalgia: Secondary | ICD-10-CM | POA: Diagnosis not present

## 2016-09-17 DIAGNOSIS — M9903 Segmental and somatic dysfunction of lumbar region: Secondary | ICD-10-CM | POA: Diagnosis not present

## 2016-09-17 DIAGNOSIS — M9901 Segmental and somatic dysfunction of cervical region: Secondary | ICD-10-CM | POA: Diagnosis not present

## 2016-10-15 DIAGNOSIS — M546 Pain in thoracic spine: Secondary | ICD-10-CM | POA: Diagnosis not present

## 2016-10-15 DIAGNOSIS — M9902 Segmental and somatic dysfunction of thoracic region: Secondary | ICD-10-CM | POA: Diagnosis not present

## 2016-10-15 DIAGNOSIS — M9901 Segmental and somatic dysfunction of cervical region: Secondary | ICD-10-CM | POA: Diagnosis not present

## 2016-10-15 DIAGNOSIS — M25512 Pain in left shoulder: Secondary | ICD-10-CM | POA: Diagnosis not present

## 2016-10-29 DIAGNOSIS — E785 Hyperlipidemia, unspecified: Secondary | ICD-10-CM | POA: Diagnosis not present

## 2016-10-29 DIAGNOSIS — Z0000 Encounter for general adult medical examination without abnormal findings: Secondary | ICD-10-CM | POA: Diagnosis not present

## 2016-10-29 DIAGNOSIS — F411 Generalized anxiety disorder: Secondary | ICD-10-CM | POA: Diagnosis not present

## 2016-11-05 DIAGNOSIS — H524 Presbyopia: Secondary | ICD-10-CM | POA: Diagnosis not present

## 2016-11-14 DIAGNOSIS — M9902 Segmental and somatic dysfunction of thoracic region: Secondary | ICD-10-CM | POA: Diagnosis not present

## 2016-11-14 DIAGNOSIS — M25512 Pain in left shoulder: Secondary | ICD-10-CM | POA: Diagnosis not present

## 2016-11-14 DIAGNOSIS — M9901 Segmental and somatic dysfunction of cervical region: Secondary | ICD-10-CM | POA: Diagnosis not present

## 2016-11-14 DIAGNOSIS — M546 Pain in thoracic spine: Secondary | ICD-10-CM | POA: Diagnosis not present

## 2016-11-22 ENCOUNTER — Ambulatory Visit (INDEPENDENT_AMBULATORY_CARE_PROVIDER_SITE_OTHER): Payer: BLUE CROSS/BLUE SHIELD

## 2016-11-22 ENCOUNTER — Ambulatory Visit (INDEPENDENT_AMBULATORY_CARE_PROVIDER_SITE_OTHER): Payer: BLUE CROSS/BLUE SHIELD | Admitting: Podiatry

## 2016-11-22 VITALS — Ht 64.0 in

## 2016-11-22 DIAGNOSIS — M79671 Pain in right foot: Secondary | ICD-10-CM

## 2016-11-22 DIAGNOSIS — M25571 Pain in right ankle and joints of right foot: Secondary | ICD-10-CM

## 2016-11-22 DIAGNOSIS — M779 Enthesopathy, unspecified: Secondary | ICD-10-CM | POA: Diagnosis not present

## 2016-11-22 MED ORDER — TRIAMCINOLONE ACETONIDE 10 MG/ML IJ SUSP
10.0000 mg | Freq: Once | INTRAMUSCULAR | Status: AC
Start: 2016-11-22 — End: 2016-11-22
  Administered 2016-11-22: 10 mg

## 2016-11-23 NOTE — Progress Notes (Signed)
Subjective:     Patient ID: Alejandra Reed, female   DOB: 15-Sep-1966, 51 y.o.   MRN: [id number]  HPI patient states that she has a lot of pain on the outside of the right foot and it's been going on for several months and that she then traumatized her and it's been swollen   Review of Systems  All other systems reviewed and are negative.      Objective:   Physical Exam  Constitutional: She is oriented to person, place, and time.  Cardiovascular: Intact distal pulses.   Musculoskeletal: Normal range of motion.  Neurological: She is oriented to person, place, and time.  Skin: Skin is warm.  Nursing note and vitals reviewed.  neurovascular status intact muscle strength was adequate range of motion within normal limits with patient found to have significant discomfort base of fifth metatarsal right with inflammation fluid at the insertion into the peroneal to tarsal conjunction along with swelling in the right ankle of the mild to moderate nature with mild edema noted     Assessment:     Tendinitis of the right fifth metatarsal base with peroneal irritation and also sprain right ankle    Plan:     H&P both conditions discussed. She will utilize compression elevation for the ankle and today I went ahead and I did careful sheath injection right 3 mg Kenalog 5 mg Xylocaine and advised on anti-inflammatory and ice therapy. Reappoint next several weeks and also dispensed fascial brace to lift up the lateral side of the foot  X-ray of the right foot and ankle negative for signs of fracture diastases injury or indications of calcification

## 2016-12-05 ENCOUNTER — Ambulatory Visit: Payer: BLUE CROSS/BLUE SHIELD | Admitting: Podiatry

## 2016-12-10 ENCOUNTER — Encounter: Payer: Self-pay | Admitting: Podiatry

## 2016-12-10 ENCOUNTER — Ambulatory Visit (INDEPENDENT_AMBULATORY_CARE_PROVIDER_SITE_OTHER): Payer: BLUE CROSS/BLUE SHIELD | Admitting: Podiatry

## 2016-12-10 DIAGNOSIS — M779 Enthesopathy, unspecified: Secondary | ICD-10-CM

## 2016-12-10 NOTE — Progress Notes (Signed)
Subjective:     Patient ID: Alejandra Reed, female   DOB: 10/14/1966, 51 y.o.   MRN: [id number]  HPI patient presents stating the outside of my right foot is doing a lot better with occasional discomforting or swelling   Review of Systems     Objective:   Physical Exam Neurovascular status intact muscle strength adequate with patient found to have quite a bit of discomfort in the outside of the right foot peroneal insertion but improved quite a bit from previous visit    Assessment:     Tendinitis present but improved    Plan:     Spent a great of time going over physical therapy supportive shoe gear usage shoe gear modifications and ice therapy. Patient will be seen back as needed

## 2016-12-17 DIAGNOSIS — M9901 Segmental and somatic dysfunction of cervical region: Secondary | ICD-10-CM | POA: Diagnosis not present

## 2016-12-17 DIAGNOSIS — M546 Pain in thoracic spine: Secondary | ICD-10-CM | POA: Diagnosis not present

## 2016-12-17 DIAGNOSIS — M25512 Pain in left shoulder: Secondary | ICD-10-CM | POA: Diagnosis not present

## 2016-12-17 DIAGNOSIS — M9902 Segmental and somatic dysfunction of thoracic region: Secondary | ICD-10-CM | POA: Diagnosis not present

## 2017-01-18 DIAGNOSIS — M9904 Segmental and somatic dysfunction of sacral region: Secondary | ICD-10-CM | POA: Diagnosis not present

## 2017-01-18 DIAGNOSIS — M25571 Pain in right ankle and joints of right foot: Secondary | ICD-10-CM | POA: Diagnosis not present

## 2017-01-18 DIAGNOSIS — M9903 Segmental and somatic dysfunction of lumbar region: Secondary | ICD-10-CM | POA: Diagnosis not present

## 2017-01-18 DIAGNOSIS — M9902 Segmental and somatic dysfunction of thoracic region: Secondary | ICD-10-CM | POA: Diagnosis not present

## 2017-03-04 DIAGNOSIS — M9904 Segmental and somatic dysfunction of sacral region: Secondary | ICD-10-CM | POA: Diagnosis not present

## 2017-03-04 DIAGNOSIS — M25571 Pain in right ankle and joints of right foot: Secondary | ICD-10-CM | POA: Diagnosis not present

## 2017-03-04 DIAGNOSIS — M9902 Segmental and somatic dysfunction of thoracic region: Secondary | ICD-10-CM | POA: Diagnosis not present

## 2017-03-04 DIAGNOSIS — M9903 Segmental and somatic dysfunction of lumbar region: Secondary | ICD-10-CM | POA: Diagnosis not present

## 2017-03-26 ENCOUNTER — Other Ambulatory Visit: Payer: Self-pay | Admitting: Family Medicine

## 2017-03-26 DIAGNOSIS — Z1231 Encounter for screening mammogram for malignant neoplasm of breast: Secondary | ICD-10-CM

## 2017-04-08 DIAGNOSIS — M25511 Pain in right shoulder: Secondary | ICD-10-CM | POA: Diagnosis not present

## 2017-04-08 DIAGNOSIS — M25561 Pain in right knee: Secondary | ICD-10-CM | POA: Diagnosis not present

## 2017-04-08 DIAGNOSIS — M25531 Pain in right wrist: Secondary | ICD-10-CM | POA: Diagnosis not present

## 2017-04-08 DIAGNOSIS — M9905 Segmental and somatic dysfunction of pelvic region: Secondary | ICD-10-CM | POA: Diagnosis not present

## 2017-04-22 DIAGNOSIS — M25531 Pain in right wrist: Secondary | ICD-10-CM | POA: Diagnosis not present

## 2017-04-22 DIAGNOSIS — M25561 Pain in right knee: Secondary | ICD-10-CM | POA: Diagnosis not present

## 2017-04-22 DIAGNOSIS — M25511 Pain in right shoulder: Secondary | ICD-10-CM | POA: Diagnosis not present

## 2017-04-22 DIAGNOSIS — M9905 Segmental and somatic dysfunction of pelvic region: Secondary | ICD-10-CM | POA: Diagnosis not present

## 2017-04-29 DIAGNOSIS — I10 Essential (primary) hypertension: Secondary | ICD-10-CM | POA: Diagnosis not present

## 2017-04-29 DIAGNOSIS — R350 Frequency of micturition: Secondary | ICD-10-CM | POA: Diagnosis not present

## 2017-04-29 DIAGNOSIS — F411 Generalized anxiety disorder: Secondary | ICD-10-CM | POA: Diagnosis not present

## 2017-05-06 ENCOUNTER — Ambulatory Visit
Admission: RE | Admit: 2017-05-06 | Discharge: 2017-05-06 | Disposition: A | Discharge status: Home / Self Care | Payer: BLUE CROSS/BLUE SHIELD | Source: Ambulatory Visit | Attending: Family Medicine | Admitting: Family Medicine

## 2017-05-06 DIAGNOSIS — Z1231 Encounter for screening mammogram for malignant neoplasm of breast: Secondary | ICD-10-CM

## 2017-05-10 DIAGNOSIS — M25561 Pain in right knee: Secondary | ICD-10-CM | POA: Diagnosis not present

## 2017-05-10 DIAGNOSIS — M25531 Pain in right wrist: Secondary | ICD-10-CM | POA: Diagnosis not present

## 2017-05-10 DIAGNOSIS — M25511 Pain in right shoulder: Secondary | ICD-10-CM | POA: Diagnosis not present

## 2017-05-10 DIAGNOSIS — M9905 Segmental and somatic dysfunction of pelvic region: Secondary | ICD-10-CM | POA: Diagnosis not present

## 2017-06-24 DIAGNOSIS — M9902 Segmental and somatic dysfunction of thoracic region: Secondary | ICD-10-CM | POA: Diagnosis not present

## 2017-06-24 DIAGNOSIS — M542 Cervicalgia: Secondary | ICD-10-CM | POA: Diagnosis not present

## 2017-06-24 DIAGNOSIS — M9901 Segmental and somatic dysfunction of cervical region: Secondary | ICD-10-CM | POA: Diagnosis not present

## 2017-06-24 DIAGNOSIS — M25562 Pain in left knee: Secondary | ICD-10-CM | POA: Diagnosis not present

## 2017-07-29 DIAGNOSIS — M542 Cervicalgia: Secondary | ICD-10-CM | POA: Diagnosis not present

## 2017-07-29 DIAGNOSIS — M9901 Segmental and somatic dysfunction of cervical region: Secondary | ICD-10-CM | POA: Diagnosis not present

## 2017-07-29 DIAGNOSIS — M9902 Segmental and somatic dysfunction of thoracic region: Secondary | ICD-10-CM | POA: Diagnosis not present

## 2017-07-29 DIAGNOSIS — M25562 Pain in left knee: Secondary | ICD-10-CM | POA: Diagnosis not present

## 2017-09-02 DIAGNOSIS — M9904 Segmental and somatic dysfunction of sacral region: Secondary | ICD-10-CM | POA: Diagnosis not present

## 2017-09-02 DIAGNOSIS — M9903 Segmental and somatic dysfunction of lumbar region: Secondary | ICD-10-CM | POA: Diagnosis not present

## 2017-09-02 DIAGNOSIS — M5431 Sciatica, right side: Secondary | ICD-10-CM | POA: Diagnosis not present

## 2017-09-02 DIAGNOSIS — M25571 Pain in right ankle and joints of right foot: Secondary | ICD-10-CM | POA: Diagnosis not present

## 2017-10-14 DIAGNOSIS — M9902 Segmental and somatic dysfunction of thoracic region: Secondary | ICD-10-CM | POA: Diagnosis not present

## 2017-10-14 DIAGNOSIS — M9901 Segmental and somatic dysfunction of cervical region: Secondary | ICD-10-CM | POA: Diagnosis not present

## 2017-10-14 DIAGNOSIS — M542 Cervicalgia: Secondary | ICD-10-CM | POA: Diagnosis not present

## 2017-10-14 DIAGNOSIS — M9903 Segmental and somatic dysfunction of lumbar region: Secondary | ICD-10-CM | POA: Diagnosis not present

## 2017-11-06 DIAGNOSIS — H5213 Myopia, bilateral: Secondary | ICD-10-CM | POA: Diagnosis not present

## 2017-11-08 DIAGNOSIS — M9901 Segmental and somatic dysfunction of cervical region: Secondary | ICD-10-CM | POA: Diagnosis not present

## 2017-11-08 DIAGNOSIS — M542 Cervicalgia: Secondary | ICD-10-CM | POA: Diagnosis not present

## 2017-11-08 DIAGNOSIS — M9903 Segmental and somatic dysfunction of lumbar region: Secondary | ICD-10-CM | POA: Diagnosis not present

## 2017-11-08 DIAGNOSIS — M9902 Segmental and somatic dysfunction of thoracic region: Secondary | ICD-10-CM | POA: Diagnosis not present

## 2017-12-09 DIAGNOSIS — M25511 Pain in right shoulder: Secondary | ICD-10-CM | POA: Diagnosis not present

## 2017-12-09 DIAGNOSIS — M9903 Segmental and somatic dysfunction of lumbar region: Secondary | ICD-10-CM | POA: Diagnosis not present

## 2017-12-09 DIAGNOSIS — M9901 Segmental and somatic dysfunction of cervical region: Secondary | ICD-10-CM | POA: Diagnosis not present

## 2017-12-09 DIAGNOSIS — M9904 Segmental and somatic dysfunction of sacral region: Secondary | ICD-10-CM | POA: Diagnosis not present

## 2017-12-26 DIAGNOSIS — I10 Essential (primary) hypertension: Secondary | ICD-10-CM | POA: Diagnosis not present

## 2017-12-26 DIAGNOSIS — F411 Generalized anxiety disorder: Secondary | ICD-10-CM | POA: Diagnosis not present

## 2018-01-17 DIAGNOSIS — M25511 Pain in right shoulder: Secondary | ICD-10-CM | POA: Diagnosis not present

## 2018-01-17 DIAGNOSIS — M542 Cervicalgia: Secondary | ICD-10-CM | POA: Diagnosis not present

## 2018-01-17 DIAGNOSIS — M9903 Segmental and somatic dysfunction of lumbar region: Secondary | ICD-10-CM | POA: Diagnosis not present

## 2018-01-17 DIAGNOSIS — M9901 Segmental and somatic dysfunction of cervical region: Secondary | ICD-10-CM | POA: Diagnosis not present

## 2018-02-24 DIAGNOSIS — M9901 Segmental and somatic dysfunction of cervical region: Secondary | ICD-10-CM | POA: Diagnosis not present

## 2018-02-24 DIAGNOSIS — M542 Cervicalgia: Secondary | ICD-10-CM | POA: Diagnosis not present

## 2018-02-24 DIAGNOSIS — M722 Plantar fascial fibromatosis: Secondary | ICD-10-CM | POA: Diagnosis not present

## 2018-02-24 DIAGNOSIS — G44209 Tension-type headache, unspecified, not intractable: Secondary | ICD-10-CM | POA: Diagnosis not present

## 2018-03-25 DIAGNOSIS — E785 Hyperlipidemia, unspecified: Secondary | ICD-10-CM | POA: Diagnosis not present

## 2018-03-25 DIAGNOSIS — I10 Essential (primary) hypertension: Secondary | ICD-10-CM | POA: Diagnosis not present

## 2018-03-25 DIAGNOSIS — E559 Vitamin D deficiency, unspecified: Secondary | ICD-10-CM | POA: Diagnosis not present

## 2018-03-25 DIAGNOSIS — Z0000 Encounter for general adult medical examination without abnormal findings: Secondary | ICD-10-CM | POA: Diagnosis not present

## 2018-03-31 ENCOUNTER — Other Ambulatory Visit: Payer: Self-pay | Admitting: Family Medicine

## 2018-03-31 DIAGNOSIS — M545 Low back pain: Secondary | ICD-10-CM | POA: Diagnosis not present

## 2018-03-31 DIAGNOSIS — M25571 Pain in right ankle and joints of right foot: Secondary | ICD-10-CM | POA: Diagnosis not present

## 2018-03-31 DIAGNOSIS — M9903 Segmental and somatic dysfunction of lumbar region: Secondary | ICD-10-CM | POA: Diagnosis not present

## 2018-03-31 DIAGNOSIS — Z1231 Encounter for screening mammogram for malignant neoplasm of breast: Secondary | ICD-10-CM

## 2018-03-31 DIAGNOSIS — M9901 Segmental and somatic dysfunction of cervical region: Secondary | ICD-10-CM | POA: Diagnosis not present

## 2018-05-05 DIAGNOSIS — M9901 Segmental and somatic dysfunction of cervical region: Secondary | ICD-10-CM | POA: Diagnosis not present

## 2018-05-05 DIAGNOSIS — M542 Cervicalgia: Secondary | ICD-10-CM | POA: Diagnosis not present

## 2018-05-05 DIAGNOSIS — M25561 Pain in right knee: Secondary | ICD-10-CM | POA: Diagnosis not present

## 2018-05-05 DIAGNOSIS — M25512 Pain in left shoulder: Secondary | ICD-10-CM | POA: Diagnosis not present

## 2018-05-08 ENCOUNTER — Ambulatory Visit
Admission: RE | Admit: 2018-05-08 | Discharge: 2018-05-08 | Disposition: A | Discharge status: Home / Self Care | Payer: BLUE CROSS/BLUE SHIELD | Source: Ambulatory Visit | Attending: Family Medicine | Admitting: Family Medicine

## 2018-05-08 DIAGNOSIS — Z1231 Encounter for screening mammogram for malignant neoplasm of breast: Secondary | ICD-10-CM

## 2018-06-09 DIAGNOSIS — M9903 Segmental and somatic dysfunction of lumbar region: Secondary | ICD-10-CM | POA: Diagnosis not present

## 2018-06-09 DIAGNOSIS — M25511 Pain in right shoulder: Secondary | ICD-10-CM | POA: Diagnosis not present

## 2018-06-09 DIAGNOSIS — M722 Plantar fascial fibromatosis: Secondary | ICD-10-CM | POA: Diagnosis not present

## 2018-06-09 DIAGNOSIS — M9905 Segmental and somatic dysfunction of pelvic region: Secondary | ICD-10-CM | POA: Diagnosis not present

## 2018-07-22 DIAGNOSIS — M79642 Pain in left hand: Secondary | ICD-10-CM | POA: Diagnosis not present

## 2018-07-25 DIAGNOSIS — M25572 Pain in left ankle and joints of left foot: Secondary | ICD-10-CM | POA: Diagnosis not present

## 2018-07-25 DIAGNOSIS — M9903 Segmental and somatic dysfunction of lumbar region: Secondary | ICD-10-CM | POA: Diagnosis not present

## 2018-07-25 DIAGNOSIS — M542 Cervicalgia: Secondary | ICD-10-CM | POA: Diagnosis not present

## 2018-07-25 DIAGNOSIS — M9901 Segmental and somatic dysfunction of cervical region: Secondary | ICD-10-CM | POA: Diagnosis not present

## 2018-07-29 DIAGNOSIS — E559 Vitamin D deficiency, unspecified: Secondary | ICD-10-CM | POA: Diagnosis not present

## 2018-07-29 DIAGNOSIS — Z23 Encounter for immunization: Secondary | ICD-10-CM | POA: Diagnosis not present

## 2018-07-29 DIAGNOSIS — E785 Hyperlipidemia, unspecified: Secondary | ICD-10-CM | POA: Diagnosis not present

## 2018-07-29 DIAGNOSIS — I10 Essential (primary) hypertension: Secondary | ICD-10-CM | POA: Diagnosis not present

## 2018-07-29 DIAGNOSIS — F411 Generalized anxiety disorder: Secondary | ICD-10-CM | POA: Diagnosis not present

## 2018-07-30 DIAGNOSIS — M79642 Pain in left hand: Secondary | ICD-10-CM | POA: Diagnosis not present

## 2018-08-08 DIAGNOSIS — M67332 Transient synovitis, left wrist: Secondary | ICD-10-CM | POA: Diagnosis not present

## 2018-08-08 DIAGNOSIS — M79642 Pain in left hand: Secondary | ICD-10-CM | POA: Diagnosis not present

## 2018-08-15 DIAGNOSIS — M9903 Segmental and somatic dysfunction of lumbar region: Secondary | ICD-10-CM | POA: Diagnosis not present

## 2018-08-15 DIAGNOSIS — M25572 Pain in left ankle and joints of left foot: Secondary | ICD-10-CM | POA: Diagnosis not present

## 2018-08-15 DIAGNOSIS — M9901 Segmental and somatic dysfunction of cervical region: Secondary | ICD-10-CM | POA: Diagnosis not present

## 2018-08-15 DIAGNOSIS — M542 Cervicalgia: Secondary | ICD-10-CM | POA: Diagnosis not present

## 2018-08-29 DIAGNOSIS — M79642 Pain in left hand: Secondary | ICD-10-CM | POA: Diagnosis not present

## 2018-10-06 DIAGNOSIS — M25562 Pain in left knee: Secondary | ICD-10-CM | POA: Diagnosis not present

## 2018-10-06 DIAGNOSIS — M9903 Segmental and somatic dysfunction of lumbar region: Secondary | ICD-10-CM | POA: Diagnosis not present

## 2018-10-06 DIAGNOSIS — M9901 Segmental and somatic dysfunction of cervical region: Secondary | ICD-10-CM | POA: Diagnosis not present

## 2018-10-06 DIAGNOSIS — M9904 Segmental and somatic dysfunction of sacral region: Secondary | ICD-10-CM | POA: Diagnosis not present

## 2018-10-07 DIAGNOSIS — M064 Inflammatory polyarthropathy: Secondary | ICD-10-CM | POA: Diagnosis not present

## 2018-10-07 DIAGNOSIS — R5383 Other fatigue: Secondary | ICD-10-CM | POA: Diagnosis not present

## 2018-10-07 DIAGNOSIS — M2550 Pain in unspecified joint: Secondary | ICD-10-CM | POA: Diagnosis not present

## 2018-10-14 DIAGNOSIS — M79642 Pain in left hand: Secondary | ICD-10-CM | POA: Diagnosis not present

## 2018-10-14 DIAGNOSIS — M65832 Other synovitis and tenosynovitis, left forearm: Secondary | ICD-10-CM | POA: Diagnosis not present

## 2018-11-03 DIAGNOSIS — M9903 Segmental and somatic dysfunction of lumbar region: Secondary | ICD-10-CM | POA: Diagnosis not present

## 2018-11-03 DIAGNOSIS — M9904 Segmental and somatic dysfunction of sacral region: Secondary | ICD-10-CM | POA: Diagnosis not present

## 2018-11-03 DIAGNOSIS — M9901 Segmental and somatic dysfunction of cervical region: Secondary | ICD-10-CM | POA: Diagnosis not present

## 2018-11-03 DIAGNOSIS — M25562 Pain in left knee: Secondary | ICD-10-CM | POA: Diagnosis not present

## 2018-11-06 DIAGNOSIS — M0600 Rheumatoid arthritis without rheumatoid factor, unspecified site: Secondary | ICD-10-CM | POA: Diagnosis not present

## 2018-11-06 DIAGNOSIS — M2550 Pain in unspecified joint: Secondary | ICD-10-CM | POA: Diagnosis not present

## 2018-11-20 DIAGNOSIS — H04123 Dry eye syndrome of bilateral lacrimal glands: Secondary | ICD-10-CM | POA: Diagnosis not present

## 2018-12-01 DIAGNOSIS — M25511 Pain in right shoulder: Secondary | ICD-10-CM | POA: Diagnosis not present

## 2018-12-01 DIAGNOSIS — M722 Plantar fascial fibromatosis: Secondary | ICD-10-CM | POA: Diagnosis not present

## 2018-12-01 DIAGNOSIS — M9901 Segmental and somatic dysfunction of cervical region: Secondary | ICD-10-CM | POA: Diagnosis not present

## 2018-12-01 DIAGNOSIS — M542 Cervicalgia: Secondary | ICD-10-CM | POA: Diagnosis not present

## 2018-12-23 DIAGNOSIS — M0600 Rheumatoid arthritis without rheumatoid factor, unspecified site: Secondary | ICD-10-CM | POA: Diagnosis not present

## 2018-12-23 DIAGNOSIS — M2550 Pain in unspecified joint: Secondary | ICD-10-CM | POA: Diagnosis not present

## 2018-12-29 DIAGNOSIS — M542 Cervicalgia: Secondary | ICD-10-CM | POA: Diagnosis not present

## 2018-12-29 DIAGNOSIS — M25511 Pain in right shoulder: Secondary | ICD-10-CM | POA: Diagnosis not present

## 2018-12-29 DIAGNOSIS — M722 Plantar fascial fibromatosis: Secondary | ICD-10-CM | POA: Diagnosis not present

## 2018-12-29 DIAGNOSIS — M9901 Segmental and somatic dysfunction of cervical region: Secondary | ICD-10-CM | POA: Diagnosis not present

## 2018-12-30 DIAGNOSIS — M65832 Other synovitis and tenosynovitis, left forearm: Secondary | ICD-10-CM | POA: Diagnosis not present

## 2018-12-30 DIAGNOSIS — M79642 Pain in left hand: Secondary | ICD-10-CM | POA: Diagnosis not present

## 2019-02-05 DIAGNOSIS — M2550 Pain in unspecified joint: Secondary | ICD-10-CM | POA: Diagnosis not present

## 2019-02-05 DIAGNOSIS — Z79899 Other long term (current) drug therapy: Secondary | ICD-10-CM | POA: Diagnosis not present

## 2019-02-05 DIAGNOSIS — M0600 Rheumatoid arthritis without rheumatoid factor, unspecified site: Secondary | ICD-10-CM | POA: Diagnosis not present

## 2019-03-30 DIAGNOSIS — M0600 Rheumatoid arthritis without rheumatoid factor, unspecified site: Secondary | ICD-10-CM | POA: Diagnosis not present

## 2019-03-30 DIAGNOSIS — E785 Hyperlipidemia, unspecified: Secondary | ICD-10-CM | POA: Diagnosis not present

## 2019-03-30 DIAGNOSIS — I10 Essential (primary) hypertension: Secondary | ICD-10-CM | POA: Diagnosis not present

## 2019-03-30 DIAGNOSIS — E559 Vitamin D deficiency, unspecified: Secondary | ICD-10-CM | POA: Diagnosis not present

## 2019-03-30 DIAGNOSIS — Z0000 Encounter for general adult medical examination without abnormal findings: Secondary | ICD-10-CM | POA: Diagnosis not present

## 2019-04-01 ENCOUNTER — Other Ambulatory Visit: Payer: Self-pay | Admitting: Family Medicine

## 2019-04-01 DIAGNOSIS — N950 Postmenopausal bleeding: Secondary | ICD-10-CM

## 2019-04-14 ENCOUNTER — Ambulatory Visit
Admission: RE | Admit: 2019-04-14 | Discharge: 2019-04-14 | Disposition: A | Discharge status: Home / Self Care | Payer: BLUE CROSS/BLUE SHIELD | Source: Ambulatory Visit | Attending: Family Medicine | Admitting: Family Medicine

## 2019-04-14 ENCOUNTER — Other Ambulatory Visit: Payer: Self-pay

## 2019-04-14 ENCOUNTER — Other Ambulatory Visit: Payer: Self-pay | Admitting: Family Medicine

## 2019-04-14 DIAGNOSIS — N950 Postmenopausal bleeding: Secondary | ICD-10-CM

## 2019-04-15 ENCOUNTER — Other Ambulatory Visit: Payer: Self-pay | Admitting: Family Medicine

## 2019-04-15 DIAGNOSIS — Z1231 Encounter for screening mammogram for malignant neoplasm of breast: Secondary | ICD-10-CM

## 2019-04-30 DIAGNOSIS — M65832 Other synovitis and tenosynovitis, left forearm: Secondary | ICD-10-CM | POA: Diagnosis not present

## 2019-06-02 DIAGNOSIS — Z20828 Contact with and (suspected) exposure to other viral communicable diseases: Secondary | ICD-10-CM | POA: Diagnosis not present

## 2019-06-02 DIAGNOSIS — Z711 Person with feared health complaint in whom no diagnosis is made: Secondary | ICD-10-CM | POA: Diagnosis not present

## 2019-06-03 ENCOUNTER — Ambulatory Visit: Payer: BLUE CROSS/BLUE SHIELD

## 2019-07-06 DIAGNOSIS — M2550 Pain in unspecified joint: Secondary | ICD-10-CM | POA: Diagnosis not present

## 2019-07-06 DIAGNOSIS — Z79899 Other long term (current) drug therapy: Secondary | ICD-10-CM | POA: Diagnosis not present

## 2019-07-06 DIAGNOSIS — M0600 Rheumatoid arthritis without rheumatoid factor, unspecified site: Secondary | ICD-10-CM | POA: Diagnosis not present

## 2019-07-16 ENCOUNTER — Other Ambulatory Visit: Payer: Self-pay

## 2019-07-16 ENCOUNTER — Ambulatory Visit
Admission: RE | Admit: 2019-07-16 | Discharge: 2019-07-16 | Disposition: A | Discharge status: Home / Self Care | Payer: BC Managed Care – PPO | Source: Ambulatory Visit | Attending: Family Medicine | Admitting: Family Medicine

## 2019-07-16 DIAGNOSIS — Z1231 Encounter for screening mammogram for malignant neoplasm of breast: Secondary | ICD-10-CM

## 2019-07-17 ENCOUNTER — Other Ambulatory Visit: Payer: Self-pay | Admitting: Family Medicine

## 2019-07-17 DIAGNOSIS — R928 Other abnormal and inconclusive findings on diagnostic imaging of breast: Secondary | ICD-10-CM

## 2019-07-24 ENCOUNTER — Other Ambulatory Visit: Payer: Self-pay

## 2019-07-24 ENCOUNTER — Ambulatory Visit: Payer: BC Managed Care – PPO

## 2019-07-24 ENCOUNTER — Ambulatory Visit
Admission: RE | Admit: 2019-07-24 | Discharge: 2019-07-24 | Disposition: A | Discharge status: Home / Self Care | Payer: BC Managed Care – PPO | Source: Ambulatory Visit | Attending: Family Medicine | Admitting: Family Medicine

## 2019-07-24 DIAGNOSIS — R928 Other abnormal and inconclusive findings on diagnostic imaging of breast: Secondary | ICD-10-CM

## 2019-08-06 DIAGNOSIS — Z124 Encounter for screening for malignant neoplasm of cervix: Secondary | ICD-10-CM | POA: Diagnosis not present

## 2019-08-06 DIAGNOSIS — N950 Postmenopausal bleeding: Secondary | ICD-10-CM | POA: Diagnosis not present

## 2019-08-24 ENCOUNTER — Other Ambulatory Visit: Payer: Self-pay | Admitting: Obstetrics & Gynecology

## 2019-08-24 DIAGNOSIS — N950 Postmenopausal bleeding: Secondary | ICD-10-CM | POA: Diagnosis not present

## 2019-10-08 DIAGNOSIS — I10 Essential (primary) hypertension: Secondary | ICD-10-CM | POA: Diagnosis not present

## 2019-10-08 DIAGNOSIS — F411 Generalized anxiety disorder: Secondary | ICD-10-CM | POA: Diagnosis not present

## 2019-10-08 DIAGNOSIS — E162 Hypoglycemia, unspecified: Secondary | ICD-10-CM | POA: Diagnosis not present

## 2019-11-24 DIAGNOSIS — H1013 Acute atopic conjunctivitis, bilateral: Secondary | ICD-10-CM | POA: Diagnosis not present

## 2019-11-24 DIAGNOSIS — H04123 Dry eye syndrome of bilateral lacrimal glands: Secondary | ICD-10-CM | POA: Diagnosis not present

## 2019-11-30 DIAGNOSIS — I10 Essential (primary) hypertension: Secondary | ICD-10-CM | POA: Diagnosis not present

## 2019-11-30 DIAGNOSIS — J3089 Other allergic rhinitis: Secondary | ICD-10-CM | POA: Diagnosis not present

## 2019-11-30 DIAGNOSIS — Z7189 Other specified counseling: Secondary | ICD-10-CM | POA: Diagnosis not present

## 2019-11-30 DIAGNOSIS — F411 Generalized anxiety disorder: Secondary | ICD-10-CM | POA: Diagnosis not present

## 2019-12-25 ENCOUNTER — Encounter: Payer: Self-pay | Admitting: Gastroenterology

## 2020-01-06 DIAGNOSIS — Z111 Encounter for screening for respiratory tuberculosis: Secondary | ICD-10-CM | POA: Diagnosis not present

## 2020-01-06 DIAGNOSIS — M0600 Rheumatoid arthritis without rheumatoid factor, unspecified site: Secondary | ICD-10-CM | POA: Diagnosis not present

## 2020-01-06 DIAGNOSIS — Z79899 Other long term (current) drug therapy: Secondary | ICD-10-CM | POA: Diagnosis not present

## 2020-01-06 DIAGNOSIS — M2550 Pain in unspecified joint: Secondary | ICD-10-CM | POA: Diagnosis not present

## 2020-02-01 DIAGNOSIS — M150 Primary generalized (osteo)arthritis: Secondary | ICD-10-CM | POA: Diagnosis not present

## 2020-02-01 DIAGNOSIS — M0600 Rheumatoid arthritis without rheumatoid factor, unspecified site: Secondary | ICD-10-CM | POA: Diagnosis not present

## 2020-02-01 DIAGNOSIS — M2550 Pain in unspecified joint: Secondary | ICD-10-CM | POA: Diagnosis not present

## 2020-03-21 ENCOUNTER — Encounter: Payer: Self-pay | Admitting: Gastroenterology

## 2020-04-01 DIAGNOSIS — D849 Immunodeficiency, unspecified: Secondary | ICD-10-CM | POA: Diagnosis not present

## 2020-04-01 DIAGNOSIS — Z0000 Encounter for general adult medical examination without abnormal findings: Secondary | ICD-10-CM | POA: Diagnosis not present

## 2020-04-01 DIAGNOSIS — Z23 Encounter for immunization: Secondary | ICD-10-CM | POA: Diagnosis not present

## 2020-04-01 DIAGNOSIS — I10 Essential (primary) hypertension: Secondary | ICD-10-CM | POA: Diagnosis not present

## 2020-04-01 DIAGNOSIS — E785 Hyperlipidemia, unspecified: Secondary | ICD-10-CM | POA: Diagnosis not present

## 2020-04-01 DIAGNOSIS — E559 Vitamin D deficiency, unspecified: Secondary | ICD-10-CM | POA: Diagnosis not present

## 2020-04-06 DIAGNOSIS — M150 Primary generalized (osteo)arthritis: Secondary | ICD-10-CM | POA: Diagnosis not present

## 2020-04-06 DIAGNOSIS — M2550 Pain in unspecified joint: Secondary | ICD-10-CM | POA: Diagnosis not present

## 2020-04-06 DIAGNOSIS — Z79899 Other long term (current) drug therapy: Secondary | ICD-10-CM | POA: Diagnosis not present

## 2020-04-06 DIAGNOSIS — M0600 Rheumatoid arthritis without rheumatoid factor, unspecified site: Secondary | ICD-10-CM | POA: Diagnosis not present

## 2020-05-05 ENCOUNTER — Telehealth: Payer: Self-pay | Admitting: *Deleted

## 2020-05-05 ENCOUNTER — Ambulatory Visit (AMBULATORY_SURGERY_CENTER): Payer: Self-pay | Admitting: *Deleted

## 2020-05-05 ENCOUNTER — Other Ambulatory Visit: Payer: Self-pay

## 2020-05-05 VITALS — Ht 64.0 in | Wt 280.0 lb

## 2020-05-05 DIAGNOSIS — Z800 Family history of malignant neoplasm of digestive organs: Secondary | ICD-10-CM

## 2020-05-05 MED ORDER — SUTAB 1479-225-188 MG PO TABS: 1.0000 | ORAL_TABLET | Freq: Once | ORAL | 0 refills | Status: AC

## 2020-05-05 NOTE — Telephone Encounter (Signed)
Dr Russella Dar,   Ms Corcino is coming in for recall colonoscopy.  She is describing some dysphagia symptoms Also she is having occasional GERD.  Does she need an office visit first or can you add an EGD?   Thank you

## 2020-05-05 NOTE — Telephone Encounter (Signed)
Please schedule an office visit with me or an APP to further evaluate her dysphagia.

## 2020-05-11 ENCOUNTER — Encounter: Payer: Self-pay | Admitting: Gastroenterology

## 2020-05-19 ENCOUNTER — Encounter: Payer: BC Managed Care – PPO | Admitting: Gastroenterology

## 2020-06-09 ENCOUNTER — Ambulatory Visit: Payer: BC Managed Care – PPO | Admitting: Physician Assistant

## 2020-06-09 ENCOUNTER — Encounter: Payer: Self-pay | Admitting: Physician Assistant

## 2020-06-09 VITALS — BP 128/78 | HR 88 | Ht 64.0 in | Wt 281.0 lb

## 2020-06-09 DIAGNOSIS — R1314 Dysphagia, pharyngoesophageal phase: Secondary | ICD-10-CM

## 2020-06-09 DIAGNOSIS — K580 Irritable bowel syndrome with diarrhea: Secondary | ICD-10-CM

## 2020-06-09 DIAGNOSIS — Z800 Family history of malignant neoplasm of digestive organs: Secondary | ICD-10-CM | POA: Diagnosis not present

## 2020-06-09 MED ORDER — ESOMEPRAZOLE MAGNESIUM 40 MG PO CPDR: 40.0000 mg | DELAYED_RELEASE_CAPSULE | Freq: Every day | ORAL | 3 refills | Status: AC

## 2020-06-09 MED ORDER — HYOSCYAMINE SULFATE 0.125 MG SL SUBL
0.1250 mg | SUBLINGUAL_TABLET | SUBLINGUAL | 5 refills | Status: DC | PRN
Start: 2020-06-09 — End: 2021-05-30

## 2020-06-09 NOTE — Patient Instructions (Signed)
If you are age 54 or older, your body mass index should be between 23-30. Your Body mass index is 48.23 kg/m. If this is out of the aforementioned range listed, please consider follow up with your Primary Care Provider.  If you are age 58 or younger, your body mass index should be between 19-25. Your Body mass index is 48.23 kg/m. If this is out of the aformentioned range listed, please consider follow up with your Primary Care Provider.   We have sent the following medications to your pharmacy for you to pick up at your convenience: Nexium 40 mg daily 30-60 minutes before breakfast.  Levsin 0.125 mg SL tablets every 4-6 hours as needed for IBS symptoms.   You have been scheduled for an endoscopy and colonoscopy. Please follow the written instructions given to you at your visit today. Please pick up your prep supplies at the pharmacy within the next 1-3 days. If you use inhalers (even only as needed), please bring them with you on the day of your procedure.  Due to recent changes in healthcare laws, you may see the results of your imaging and laboratory studies on MyChart before your provider has had a chance to review them.  We understand that in some cases there may be results that are confusing or concerning to you. Not all laboratory results come back in the same time frame and the provider may be waiting for multiple results in order to interpret others.  Please give [place] 48 hours in order for your provider to thoroughly review all the results before contacting the office for clarification of your results.

## 2020-06-09 NOTE — Progress Notes (Signed)
Chief Complaint: Dysphagia and family history of colon cancer  HPI:    Alejandra Reed is a 54 year old female with a past medical history as listed below including a family history of colon cancer, known to Dr. Russella Dar, who was referred to me by Laurann Montana, MD for a complaint of dysphagia and need for surveillance colonoscopy.      01/26/2010 EGD with erosive esophagitis and a small hiatal hernia.    03/16/2015 colonoscopy was normal other than grade 1 internal hemorrhoids.  Repeat recommended in 5 years given family history of colon cancer.    Today, the patient presents to clinic and explains that over the past couple of years she has had an increasing amount of dysphagia noting that when she is eating food will seem to get stuck in the middle of her throat.  She can still breathe but this causes some anxiety and she will typically try to sip water to get it to go down, but if she can make herself burp then it will go down faster.  This has increased now to at least once a week over the past couple of months.  This is worse with foods like chicken and rice.  Also with almost daily reflux symptoms.  She is no longer on any medicine for this.    Also briefly discusses her IBS-D today.  Tells me that if she is stressed or anxious the symptoms will be worse, they are not every week but when they occur she gets a lot of bloating and cramping with diarrhea.  Tries to follow a low FODMAP diet as this seems to help symptoms.    Reminds me of her family history of colon cancer.    Denies fever, chills, blood in her stool or symptoms that awaken her from sleep.  Past Medical History:  Diagnosis Date  . ABDOMINAL PAIN-MULTIPLE SITES 01/10/2010   Qualifier: Diagnosis of  By: Russella Dar MD Bronson Curb T   . ABNORMAL FINDINGS GI TRACT 12/22/2009   Qualifier: Diagnosis of  By: Myrtie Hawk, Amy S   . Allergy   . Anxiety   . Depression   . Diarrhea 12/22/2009   Qualifier: Diagnosis of  By: Dorian Pod, Pam    .  GERD (gastroesophageal reflux disease)   . HYPERTENSION 12/22/2009   Qualifier: Diagnosis of  By: Monica Becton PA-c, Amy S   . Irritable bowel syndrome 01/10/2010   Qualifier: Diagnosis of  By: Russella Dar MD Marylu Lund   . Overweight(278.02) 12/21/2010   Qualifier: Diagnosis of  By: Antoine Poche, MD, Gerrit Heck    . Palpitations 12/21/2010   Qualifier: Diagnosis of  By: Antoine Poche, MD, Gerrit Heck    . RLQ PAIN 12/22/2009   Qualifier: Diagnosis of  By: Dorian Pod, Pam    . TRANSAMINASES, SERUM, ELEVATED 01/10/2010   Qualifier: Diagnosis of  By: Russella Dar MD Marylu Lund     Past Surgical History:  Procedure Laterality Date  . ADENOIDECTOMY    . BUNIONECTOMY     Both feet  . COLONOSCOPY    . PLANTAR FASCIA RELEASE     Right  . TREATMENT FISTULA ANAL    . UPPER GI ENDOSCOPY    . WISDOM TOOTH EXTRACTION      Current Outpatient Medications  Medication Sig Dispense Refill  . ALPRAZolam (XANAX) 0.5 MG tablet Take 0.5 mg by mouth as needed for anxiety.    Marland Kitchen atenolol (TENORMIN) 25 MG tablet TAKE 1/2 TABLET BY MOUTH TWICE A DAY  90 tablet 3  . calcium elemental as carbonate (BARIATRIC TUMS ULTRA) 400 MG tablet Chew 1,000 mg by mouth 3 (three) times daily.    . carvedilol (COREG) 12.5 MG tablet Take 12.5 mg by mouth 2 (two) times daily.    . cetirizine (ZYRTEC) 10 MG tablet Take 10 mg by mouth daily. (Patient not taking: Reported on 05/05/2020)    . Cholecalciferol (VITAMIN D) 2000 UNITS CAPS Take 1 capsule by mouth daily.    Marland Kitchen FLUoxetine (PROZAC) 10 MG capsule TK 3 CS PO ONCE D  1  . fluticasone (FLONASE) 50 MCG/ACT nasal spray PALCE 1 TO 2 SPRAY IN EACH NOSTRIL EVERY DAY (Patient not taking: Reported on 05/05/2020)  5  . Glutamine 500 MG CAPS Take 1,000 mg by mouth daily. (Patient not taking: Reported on 05/05/2020)    . HUMIRA PEN 40 MG/0.4ML PNKT Inject 1 Dose as directed once a week.    . hydrochlorothiazide (HYDRODIURIL) 12.5 MG tablet Take 12.5 mg by mouth daily.    . Omega-3 Fatty Acids (FISH OIL  PO) Take by mouth.    Marland Kitchen PAZEO 0.7 % SOLN PLACE 1 DROP IN BOTH EYES DAILY  3  . PENNSAID 2 % SOLN   5  . traMADol (ULTRAM) 50 MG tablet  (Patient not taking: Reported on 05/05/2020)  0  . vitamin E 400 UNIT capsule Take 400 Units by mouth daily.     No current facility-administered medications for this visit.    Allergies as of 06/09/2020 - Review Complete 05/05/2020  Allergen Reaction Noted  . Gluten meal  03/02/2015  . Prednisone  12/27/2009    Family History  Problem Relation Age of Onset  . Diabetes Mother   . Heart disease Mother   . Kidney disease Mother   . Colon cancer Mother 87  . Diabetes Father   . Hyperlipidemia Father   . Hypertension Father   . Esophageal cancer Neg Hx   . Stomach cancer Neg Hx   . Rectal cancer Neg Hx     Social History   Socioeconomic History  . Marital status: Single    Spouse name: Not on file  . Number of children: Not on file  . Years of education: Not on file  . Highest education level: Not on file  Occupational History  . Not on file  Tobacco Use  . Smoking status: Never Smoker  . Smokeless tobacco: Never Used  Substance and Sexual Activity  . Alcohol use: Yes    Alcohol/week: 0.0 standard drinks    Comment: rarely  . Drug use: No  . Sexual activity: Not on file  Other Topics Concern  . Not on file  Social History Narrative  . Not on file   Social Determinants of Health   Financial Resource Strain:   . Difficulty of Paying Living Expenses:   Food Insecurity:   . Worried About Programme researcher, broadcasting/film/video in the Last Year:   . Barista in the Last Year:   Transportation Needs:   . Freight forwarder (Medical):   Marland Kitchen Lack of Transportation (Non-Medical):   Physical Activity:   . Days of Exercise per Week:   . Minutes of Exercise per Session:   Stress:   . Feeling of Stress :   Social Connections:   . Frequency of Communication with Friends and Family:   . Frequency of Social Gatherings with Friends and Family:    . Attends Religious Services:   . Active  Member of Clubs or Organizations:   . Attends Banker Meetings:   Marland Kitchen Marital Status:   Intimate Partner Violence:   . Fear of Current or Ex-Partner:   . Emotionally Abused:   Marland Kitchen Physically Abused:   . Sexually Abused:     Review of Systems:    Constitutional: No weight loss, fever or chills Skin: No rash Cardiovascular: No chest pain  Respiratory: No SOB  Gastrointestinal: See HPI and otherwise negative Genitourinary: No dysuria Neurological: No headache, dizziness or syncope Musculoskeletal: No new muscle or joint pain Hematologic: No bleeding  Psychiatric: No history of depression or anxiety   Physical Exam:  Vital signs: BP 128/78   Pulse 88   Ht 5\' 4"  (1.626 m)   Wt (!) 281 lb (127.5 kg)   LMP 04/05/2017   SpO2 98%   BMI 48.23 kg/m   Constitutional:   Pleasant obese Caucasian female appears to be in NAD, Well developed, Well nourished, alert and cooperative Head:  Normocephalic and atraumatic. Eyes:   PEERL, EOMI. No icterus. Conjunctiva pink. Ears:  Normal auditory acuity. Neck:  Supple Throat: Oral cavity and pharynx without inflammation, swelling or lesion.  Respiratory: Respirations even and unlabored. Lungs clear to auscultation bilaterally.   No wheezes, crackles, or rhonchi.  Cardiovascular: Normal S1, S2. No MRG. Regular rate and rhythm. No peripheral edema, cyanosis or pallor.  Gastrointestinal:  Soft, nondistended, nontender. No rebound or guarding. Normal bowel sounds. No appreciable masses or hepatomegaly. Rectal:  Not performed.  Msk:  Symmetrical without gross deformities. Without edema, no deformity or joint abnormality.  Neurologic:  Alert and  oriented x4;  grossly normal neurologically.  Skin:   Dry and intact without significant lesions or rashes. Psychiatric:  Demonstrates good judgement and reason without abnormal affect or behaviors.  No recent labs or imaging.  Assessment: 1.  GERD:  Daily symptoms, previously had erosive esophagitis on EGD in 2011, but came off of her meds sometime ago; most likely reflux +/- erosive esophagitis with stricture 2.  Dysphagia 3.  Family history of colon cancer: Last colonoscopy 5 years ago, repeat is due now  Plan: 1.  Patient has already been scheduled for an EGD and colonoscopy on August 4 with Dr. 01-23-1972.  Did discuss risks, benefits, limitations and alternatives and patient agrees to proceed. 2.  We discussed the patient's IBS.  Prescribed Hyoscyamine sulfate 0.125 mg sublingual tabs to be used every 4-6 hours as needed for IBS symptoms #30 with 5 refills. 3.  Started the patient on Nexium 40 mg daily, 30-60 minutes before eating breakfast #30 with 5 refills. 4.  Discussed antidysphagia and antireflux measures. 5.  Patient to follow in clinic per recommendations from Dr. 12-21-1991 after procedures.  Russella Dar, PA-C [facility] 06/09/2020, 1:56 PM  Cc: 06/11/2020, MD

## 2020-06-09 NOTE — Progress Notes (Signed)
Reviewed and agree with management plan.  Amal Saiki T. Zakk Borgen, MD FACG [facility]  

## 2020-06-17 ENCOUNTER — Encounter: Payer: Self-pay | Admitting: Gastroenterology

## 2020-06-22 ENCOUNTER — Other Ambulatory Visit: Payer: Self-pay

## 2020-06-22 ENCOUNTER — Ambulatory Visit (AMBULATORY_SURGERY_CENTER): Payer: BC Managed Care – PPO | Admitting: Gastroenterology

## 2020-06-22 ENCOUNTER — Encounter: Payer: Self-pay | Admitting: Gastroenterology

## 2020-06-22 VITALS — BP 127/72 | HR 61 | Temp 96.9°F | Resp 17 | Ht 64.0 in

## 2020-06-22 DIAGNOSIS — Z1211 Encounter for screening for malignant neoplasm of colon: Secondary | ICD-10-CM | POA: Diagnosis not present

## 2020-06-22 DIAGNOSIS — K449 Diaphragmatic hernia without obstruction or gangrene: Secondary | ICD-10-CM

## 2020-06-22 DIAGNOSIS — K219 Gastro-esophageal reflux disease without esophagitis: Secondary | ICD-10-CM | POA: Diagnosis not present

## 2020-06-22 DIAGNOSIS — K3189 Other diseases of stomach and duodenum: Secondary | ICD-10-CM

## 2020-06-22 DIAGNOSIS — R1310 Dysphagia, unspecified: Secondary | ICD-10-CM | POA: Diagnosis not present

## 2020-06-22 DIAGNOSIS — Z800 Family history of malignant neoplasm of digestive organs: Secondary | ICD-10-CM

## 2020-06-22 DIAGNOSIS — R1314 Dysphagia, pharyngoesophageal phase: Secondary | ICD-10-CM | POA: Diagnosis not present

## 2020-06-22 DIAGNOSIS — K222 Esophageal obstruction: Secondary | ICD-10-CM | POA: Diagnosis not present

## 2020-06-22 DIAGNOSIS — R1319 Other dysphagia: Secondary | ICD-10-CM

## 2020-06-22 MED ORDER — SODIUM CHLORIDE 0.9 % IV SOLN
500.0000 mL | Freq: Once | INTRAVENOUS | Status: DC
Start: 2020-06-22 — End: 2020-06-22

## 2020-06-22 MED ORDER — SODIUM CHLORIDE 0.9 % IV SOLN: 500.0000 mL | Freq: Once | INTRAVENOUS | Status: DC

## 2020-06-22 NOTE — Progress Notes (Signed)
To PACU, VSS. Report to [email or web address] 

## 2020-06-22 NOTE — Op Note (Signed)
[facility] Patient Name: Alejandra Reed Procedure Date: 06/22/2020 9:46 AM MRN: [id number] Endoscopist: Meryl Dare , MD Age: 54 Referring MD:  Date of Birth: 08-04-1966 Gender: Female Account #: [id number] Procedure:                Colonoscopy Indications:              Screening in patient at increased risk: Family                            history of 1st-degree relative with colorectal                            cancer Medicines:                Monitored Anesthesia Care Procedure:                Pre-Anesthesia Assessment:                           - Prior to the procedure, a History and Physical                            was performed, and patient medications and                            allergies were reviewed. The patient's tolerance of                            previous anesthesia was also reviewed. The risks                            and benefits of the procedure and the sedation                            options and risks were discussed with the patient.                            All questions were answered, and informed consent                            was obtained. Prior Anticoagulants: The patient has                            taken no previous anticoagulant or antiplatelet                            agents. ASA Grade Assessment: II - A patient with                            mild systemic disease. After reviewing the risks                            and benefits, the patient was deemed in  satisfactory condition to undergo the procedure.                           After obtaining informed consent, the colonoscope                            was passed under direct vision. Throughout the                            procedure, the patient's blood pressure, pulse, and                            oxygen saturations were monitored continuously. The                            Colonoscope was introduced through the anus and                             advanced to the the cecum, identified by                            appendiceal orifice and ileocecal valve. The                            ileocecal valve, appendiceal orifice, and rectum                            were photographed. The quality of the bowel                            preparation was good. The colonoscopy was performed                            without difficulty. The patient tolerated the                            procedure well. Scope In: 9:50:59 AM Scope Out: 10:01:40 AM Scope Withdrawal Time: 0 hours 8 minutes 51 seconds  Total Procedure Duration: 0 hours 10 minutes 41 seconds  Findings:                 The perianal and digital rectal examinations were                            normal.                           Internal hemorrhoids were found during                            retroflexion. The hemorrhoids were small and Grade                            I (internal hemorrhoids that do not prolapse).  The exam was otherwise without abnormality on                            direct and retroflexion views. Complications:            No immediate complications. Estimated blood loss:                            None. Estimated Blood Loss:     Estimated blood loss: none. Impression:               - Internal hemorrhoids.                           - The examination was otherwise normal on direct                            and retroflexion views.                           - No specimens collected. Recommendation:           - Repeat colonoscopy in 5 years for screening                            purposes.                           - Patient has a contact number available for                            emergencies. The signs and symptoms of potential                            delayed complications were discussed with the                            patient. Return to normal activities tomorrow.                            Written  discharge instructions were provided to the                            patient.                           - Resume previous diet.                           - Continue present medications. Meryl Dare, MD 06/22/2020 10:13:12 AM This report has been signed electronically.

## 2020-06-22 NOTE — Progress Notes (Signed)
cw vitals and SH Iv.

## 2020-06-22 NOTE — Progress Notes (Signed)
Called to room to assist during endoscopic procedure.  Patient ID and intended procedure confirmed with present staff. Received instructions for my participation in the procedure from the performing physician.  

## 2020-06-22 NOTE — Op Note (Signed)
[facility] Patient Name: Alejandra Reed Procedure Date: 06/22/2020 9:46 AM MRN: [id number] Endoscopist: Meryl Dare , MD Age: 54 Referring MD:  Date of Birth: 06/08/1966 Gender: Female Account #: [id number] Procedure:                Upper GI endoscopy Indications:              Dysphagia, Gastroesophageal reflux disease Medicines:                Monitored Anesthesia Care Procedure:                Pre-Anesthesia Assessment:                           - Prior to the procedure, a History and Physical                            was performed, and patient medications and                            allergies were reviewed. The patient's tolerance of                            previous anesthesia was also reviewed. The risks                            and benefits of the procedure and the sedation                            options and risks were discussed with the patient.                            All questions were answered, and informed consent                            was obtained. Prior Anticoagulants: The patient has                            taken no previous anticoagulant or antiplatelet                            agents. ASA Grade Assessment: III - A patient with                            severe systemic disease. After reviewing the risks                            and benefits, the patient was deemed in                            satisfactory condition to undergo the procedure.                           After obtaining informed consent, the endoscope was  passed under direct vision. Throughout the                            procedure, the patient's blood pressure, pulse, and                            oxygen saturations were monitored continuously. The                            Endoscope was introduced through the mouth, and                            advanced to the second part of duodenum. The upper                            GI  endoscopy was accomplished without difficulty.                            The patient tolerated the procedure well. Scope In: Scope Out: Findings:                 One benign-appearing, intrinsic mild stenosis was                            found at the gastroesophageal junction. This                            stenosis measured 1.4 cm (inner diameter) x less                            than one cm (in length). The stenosis was                            traversed. A guidewire was placed and the scope was                            withdrawn. Dilation was performed with a Savary                            dilator with mild resistance at 16 mm.                           The exam of the esophagus was otherwise normal.                           A few localized small erosions with no bleeding and                            no stigmata of recent bleeding were found in the                            gastric body and in the gastric antrum. Biopsies  were taken with a cold forceps for histology.                           A small hiatal hernia was present.                           The exam of the stomach was otherwise normal.                           The duodenal bulb and second portion of the                            duodenum were normal. Complications:            No immediate complications. Estimated Blood Loss:     Estimated blood loss was minimal. Impression:               - Benign-appearing esophageal stenosis. Dilated.                           - Erosive gastropathy with no bleeding and no                            stigmata of recent bleeding. Biopsied.                           - Small hiatal hernia.                           - Normal duodenal bulb and second portion of the                            duodenum. Recommendation:           - Patient has a contact number available for                            emergencies. The signs and symptoms of potential                             delayed complications were discussed with the                            patient. Return to normal activities tomorrow.                            Written discharge instructions were provided to the                            patient.                           - Continue present medications.                           - Await pathology results.                           -  Clear liquid diet today, then advance as                            tolerated to soft diet today.                           - Resume prior diet tomorrow.                           - Follow antireflux measures long term. Meryl Dare, MD 06/22/2020 10:20:07 AM This report has been signed electronically.

## 2020-06-22 NOTE — Patient Instructions (Signed)
Handouts given:  Post esophoageal dilation diet, gastritis, stricture,hiatal hernia, Hemorrhoids Continue current medications Await pathology results FOLLOW POST DILATION DIET TODAY Follow antireflux measures long term.   YOU HAD AN ENDOSCOPIC PROCEDURE TODAY AT THE [facility]:   Refer to the procedure report that was given to you for any specific questions about what was found during the examination.  If the procedure report does not answer your questions, please call your gastroenterologist to clarify.  If you requested that your care partner not be given the details of your procedure findings, then the procedure report has been included in a sealed envelope for you to review at your convenience later.  YOU SHOULD EXPECT: Some feelings of bloating in the abdomen. Passage of more gas than usual.  Walking can help get rid of the air that was put into your GI tract during the procedure and reduce the bloating. If you had a lower endoscopy (such as a colonoscopy or flexible sigmoidoscopy) you may notice spotting of blood in your stool or on the toilet paper. If you underwent a bowel prep for your procedure, you may not have a normal bowel movement for a few days.  Please Note:  You might notice some irritation and congestion in your nose or some drainage.  This is from the oxygen used during your procedure.  There is no need for concern and it should clear up in a day or so.  SYMPTOMS TO REPORT IMMEDIATELY:   Following lower endoscopy (colonoscopy or flexible sigmoidoscopy):  Excessive amounts of blood in the stool  Significant tenderness or worsening of abdominal pains  Swelling of the abdomen that is new, acute  Fever of 100F or higher   Following upper endoscopy (EGD)  Vomiting of blood or coffee ground material  New chest pain or pain under the shoulder blades  Painful or persistently difficult swallowing  New shortness of breath  Fever of 100F or higher  Black,  tarry-looking stools  For urgent or emergent issues, a gastroenterologist can be reached at any hour by calling [phone]. Do not use MyChart messaging for urgent concerns.    DIET:  We do recommend a small meal at first, but then you may proceed to your regular diet.  Drink plenty of fluids but you should avoid alcoholic beverages for 24 hours.  ACTIVITY:  You should plan to take it easy for the rest of today and you should NOT DRIVE or use heavy machinery until tomorrow (because of the sedation medicines used during the test).    FOLLOW UP: Our staff will call the number listed on your records 48-72 hours following your procedure to check on you and address any questions or concerns that you may have regarding the information given to you following your procedure. If we do not reach you, we will leave a message.  We will attempt to reach you two times.  During this call, we will ask if you have developed any symptoms of COVID 19. If you develop any symptoms (ie: fever, flu-like symptoms, shortness of breath, cough etc.) before then, please call [phone].  If you test positive for Covid 19 in the 2 weeks post procedure, please call and report this information to [place].    If any biopsies were taken you will be contacted by phone or by letter within the next 1-3 weeks.  Please call us at [phone] if you have not heard about the biopsies in 3 weeks.    SIGNATURES/CONFIDENTIALITY: You  and/or your care partner have signed paperwork which will be entered into your electronic medical record.  These signatures attest to the fact that that the information above on your After Visit Summary has been reviewed and is understood.  Full responsibility of the confidentiality of this discharge information lies with you and/or your care-partner.

## 2020-06-24 ENCOUNTER — Telehealth: Payer: Self-pay | Admitting: *Deleted

## 2020-06-24 NOTE — Telephone Encounter (Signed)
  Follow up Call-  Call back number 06/22/2020  Post procedure Call Back phone  # [phone]  Permission to leave phone message Yes  Some recent data might be hidden     Patient questions:  Do you have a fever, pain , or abdominal swelling? No. Pain Score  0 *  Have you tolerated food without any problems? Yes.    Have you been able to return to your normal activities? Yes.    Do you have any questions about your discharge instructions: Diet   No. Medications  No. Follow up visit  No.  Do you have questions or concerns about your Care? No.  Actions: * If pain score is 4 or above: 1. No action needed, pain <4.Have you developed a fever since your procedure? no  2.   Have you had an respiratory symptoms (SOB or cough) since your procedure? no  3.   Have you tested positive for COVID 19 since your procedure no  4.   Have you had any family members/close contacts diagnosed with the COVID 19 since your procedure?  no   If yes to any of these questions please route to Laverna Peace, RN and Charlett Lango, RN

## 2020-07-05 ENCOUNTER — Other Ambulatory Visit: Payer: Self-pay | Admitting: Family Medicine

## 2020-07-05 DIAGNOSIS — Z1231 Encounter for screening mammogram for malignant neoplasm of breast: Secondary | ICD-10-CM

## 2020-07-06 DIAGNOSIS — Z79899 Other long term (current) drug therapy: Secondary | ICD-10-CM | POA: Diagnosis not present

## 2020-07-06 DIAGNOSIS — M150 Primary generalized (osteo)arthritis: Secondary | ICD-10-CM | POA: Diagnosis not present

## 2020-07-06 DIAGNOSIS — M0600 Rheumatoid arthritis without rheumatoid factor, unspecified site: Secondary | ICD-10-CM | POA: Diagnosis not present

## 2020-07-06 DIAGNOSIS — M2550 Pain in unspecified joint: Secondary | ICD-10-CM | POA: Diagnosis not present

## 2020-07-07 ENCOUNTER — Encounter: Payer: Self-pay | Admitting: Gastroenterology

## 2020-07-27 ENCOUNTER — Ambulatory Visit
Admission: RE | Admit: 2020-07-27 | Discharge: 2020-07-27 | Disposition: A | Discharge status: Home / Self Care | Payer: BC Managed Care – PPO | Source: Ambulatory Visit

## 2020-07-27 ENCOUNTER — Other Ambulatory Visit: Payer: Self-pay

## 2020-07-27 DIAGNOSIS — Z1231 Encounter for screening mammogram for malignant neoplasm of breast: Secondary | ICD-10-CM

## 2020-08-22 DIAGNOSIS — M9904 Segmental and somatic dysfunction of sacral region: Secondary | ICD-10-CM | POA: Diagnosis not present

## 2020-08-22 DIAGNOSIS — M25531 Pain in right wrist: Secondary | ICD-10-CM | POA: Diagnosis not present

## 2020-08-22 DIAGNOSIS — M9905 Segmental and somatic dysfunction of pelvic region: Secondary | ICD-10-CM | POA: Diagnosis not present

## 2020-08-22 DIAGNOSIS — M9903 Segmental and somatic dysfunction of lumbar region: Secondary | ICD-10-CM | POA: Diagnosis not present

## 2020-08-23 ENCOUNTER — Other Ambulatory Visit: Payer: Self-pay

## 2020-08-23 ENCOUNTER — Ambulatory Visit
Admission: RE | Admit: 2020-08-23 | Discharge: 2020-08-23 | Disposition: A | Discharge status: Home / Self Care | Payer: BC Managed Care – PPO | Source: Ambulatory Visit | Attending: Family Medicine | Admitting: Family Medicine

## 2020-08-23 DIAGNOSIS — Z1231 Encounter for screening mammogram for malignant neoplasm of breast: Secondary | ICD-10-CM | POA: Diagnosis not present

## 2020-08-26 DIAGNOSIS — M9903 Segmental and somatic dysfunction of lumbar region: Secondary | ICD-10-CM | POA: Diagnosis not present

## 2020-08-26 DIAGNOSIS — M25531 Pain in right wrist: Secondary | ICD-10-CM | POA: Diagnosis not present

## 2020-08-26 DIAGNOSIS — M9905 Segmental and somatic dysfunction of pelvic region: Secondary | ICD-10-CM | POA: Diagnosis not present

## 2020-08-26 DIAGNOSIS — M9904 Segmental and somatic dysfunction of sacral region: Secondary | ICD-10-CM | POA: Diagnosis not present

## 2020-08-29 DIAGNOSIS — M9904 Segmental and somatic dysfunction of sacral region: Secondary | ICD-10-CM | POA: Diagnosis not present

## 2020-08-29 DIAGNOSIS — M9905 Segmental and somatic dysfunction of pelvic region: Secondary | ICD-10-CM | POA: Diagnosis not present

## 2020-08-29 DIAGNOSIS — M9903 Segmental and somatic dysfunction of lumbar region: Secondary | ICD-10-CM | POA: Diagnosis not present

## 2020-08-29 DIAGNOSIS — M25531 Pain in right wrist: Secondary | ICD-10-CM | POA: Diagnosis not present

## 2020-08-31 DIAGNOSIS — M9903 Segmental and somatic dysfunction of lumbar region: Secondary | ICD-10-CM | POA: Diagnosis not present

## 2020-08-31 DIAGNOSIS — M9905 Segmental and somatic dysfunction of pelvic region: Secondary | ICD-10-CM | POA: Diagnosis not present

## 2020-08-31 DIAGNOSIS — M25531 Pain in right wrist: Secondary | ICD-10-CM | POA: Diagnosis not present

## 2020-08-31 DIAGNOSIS — M9904 Segmental and somatic dysfunction of sacral region: Secondary | ICD-10-CM | POA: Diagnosis not present

## 2020-09-07 DIAGNOSIS — M25531 Pain in right wrist: Secondary | ICD-10-CM | POA: Diagnosis not present

## 2020-09-07 DIAGNOSIS — M9904 Segmental and somatic dysfunction of sacral region: Secondary | ICD-10-CM | POA: Diagnosis not present

## 2020-09-07 DIAGNOSIS — M9903 Segmental and somatic dysfunction of lumbar region: Secondary | ICD-10-CM | POA: Diagnosis not present

## 2020-09-07 DIAGNOSIS — M9905 Segmental and somatic dysfunction of pelvic region: Secondary | ICD-10-CM | POA: Diagnosis not present

## 2020-09-12 DIAGNOSIS — M9904 Segmental and somatic dysfunction of sacral region: Secondary | ICD-10-CM | POA: Diagnosis not present

## 2020-09-12 DIAGNOSIS — M9905 Segmental and somatic dysfunction of pelvic region: Secondary | ICD-10-CM | POA: Diagnosis not present

## 2020-09-12 DIAGNOSIS — M9903 Segmental and somatic dysfunction of lumbar region: Secondary | ICD-10-CM | POA: Diagnosis not present

## 2020-09-12 DIAGNOSIS — M25531 Pain in right wrist: Secondary | ICD-10-CM | POA: Diagnosis not present

## 2020-09-14 DIAGNOSIS — M9905 Segmental and somatic dysfunction of pelvic region: Secondary | ICD-10-CM | POA: Diagnosis not present

## 2020-09-14 DIAGNOSIS — M9903 Segmental and somatic dysfunction of lumbar region: Secondary | ICD-10-CM | POA: Diagnosis not present

## 2020-09-14 DIAGNOSIS — M9904 Segmental and somatic dysfunction of sacral region: Secondary | ICD-10-CM | POA: Diagnosis not present

## 2020-09-14 DIAGNOSIS — M25531 Pain in right wrist: Secondary | ICD-10-CM | POA: Diagnosis not present

## 2020-09-26 DIAGNOSIS — M9904 Segmental and somatic dysfunction of sacral region: Secondary | ICD-10-CM | POA: Diagnosis not present

## 2020-09-26 DIAGNOSIS — M9903 Segmental and somatic dysfunction of lumbar region: Secondary | ICD-10-CM | POA: Diagnosis not present

## 2020-09-26 DIAGNOSIS — M25571 Pain in right ankle and joints of right foot: Secondary | ICD-10-CM | POA: Diagnosis not present

## 2020-09-26 DIAGNOSIS — M722 Plantar fascial fibromatosis: Secondary | ICD-10-CM | POA: Diagnosis not present

## 2020-10-04 DIAGNOSIS — E785 Hyperlipidemia, unspecified: Secondary | ICD-10-CM | POA: Diagnosis not present

## 2020-10-04 DIAGNOSIS — Z23 Encounter for immunization: Secondary | ICD-10-CM | POA: Diagnosis not present

## 2020-10-04 DIAGNOSIS — J309 Allergic rhinitis, unspecified: Secondary | ICD-10-CM | POA: Diagnosis not present

## 2020-10-04 DIAGNOSIS — I10 Essential (primary) hypertension: Secondary | ICD-10-CM | POA: Diagnosis not present

## 2020-10-06 IMAGING — MG DIGITAL SCREENING BILATERAL MAMMOGRAM WITH TOMO AND CAD
8 of 15 series · 8 of 40 positions shown · non-contrast
Comparison: Previous exam(s).

CLINICAL DATA: Screening.

EXAM:
DIGITAL SCREENING BILATERAL MAMMOGRAM WITH TOMO AND CAD

[R XCCL synth-2D]
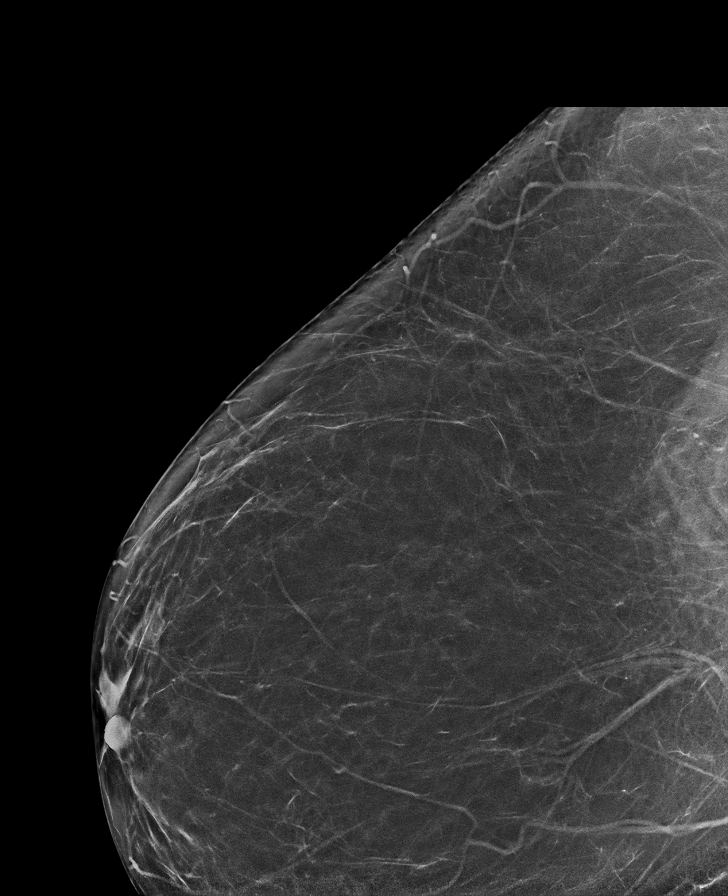

[L MLO synth-2D (1 of 2)]
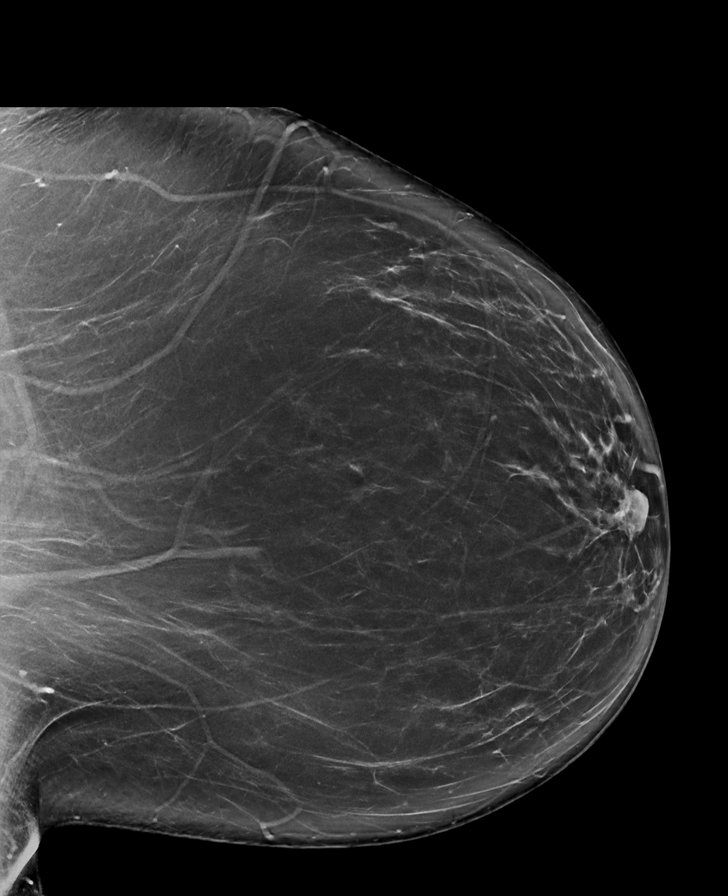

[L MLO synth-2D (2 of 2)]
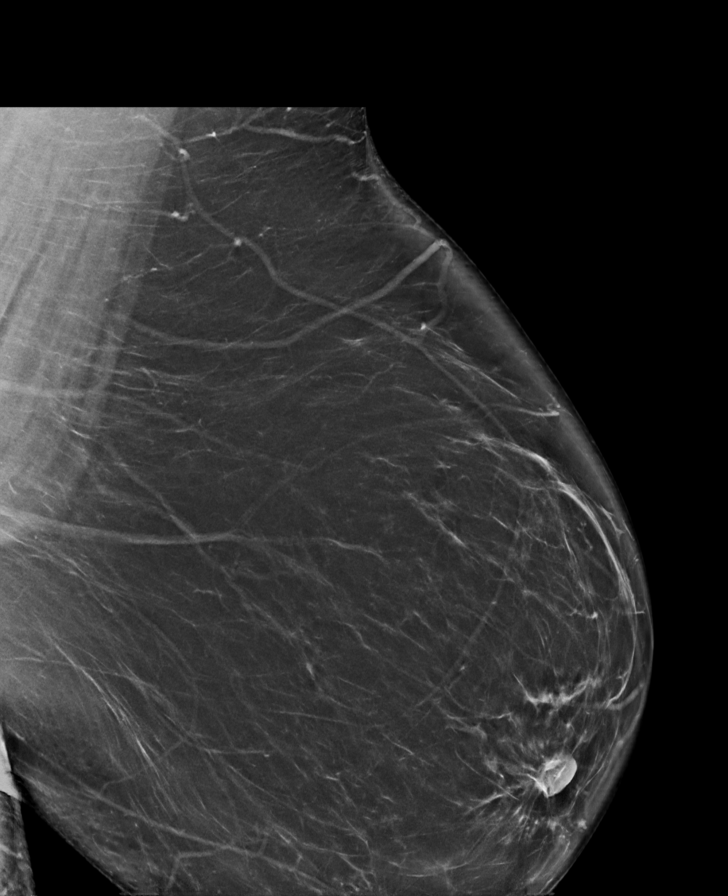

[L CC synth-2D]
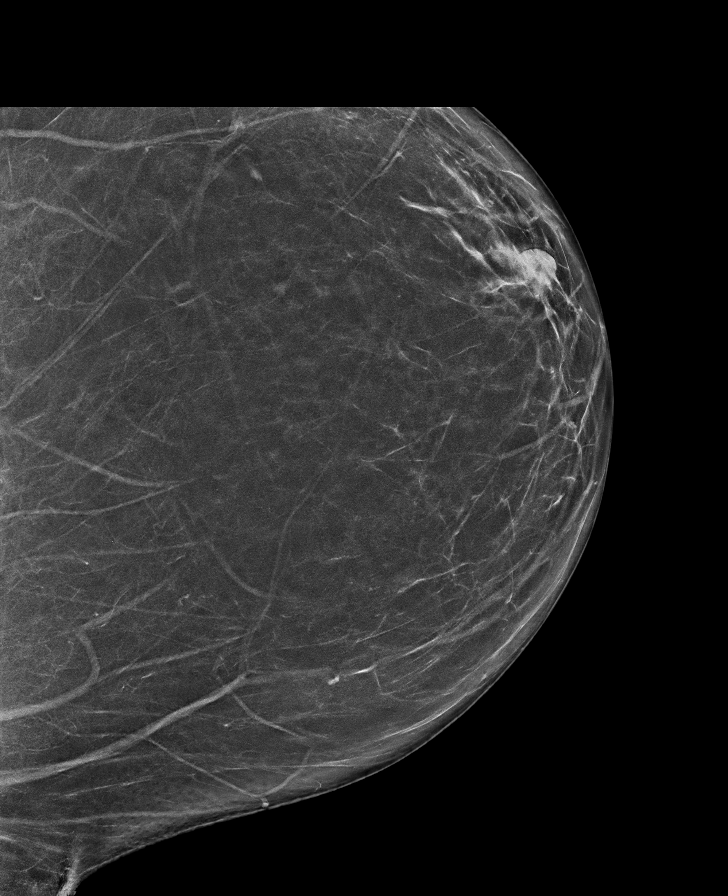

[R MLO synth-2D (1 of 2)]
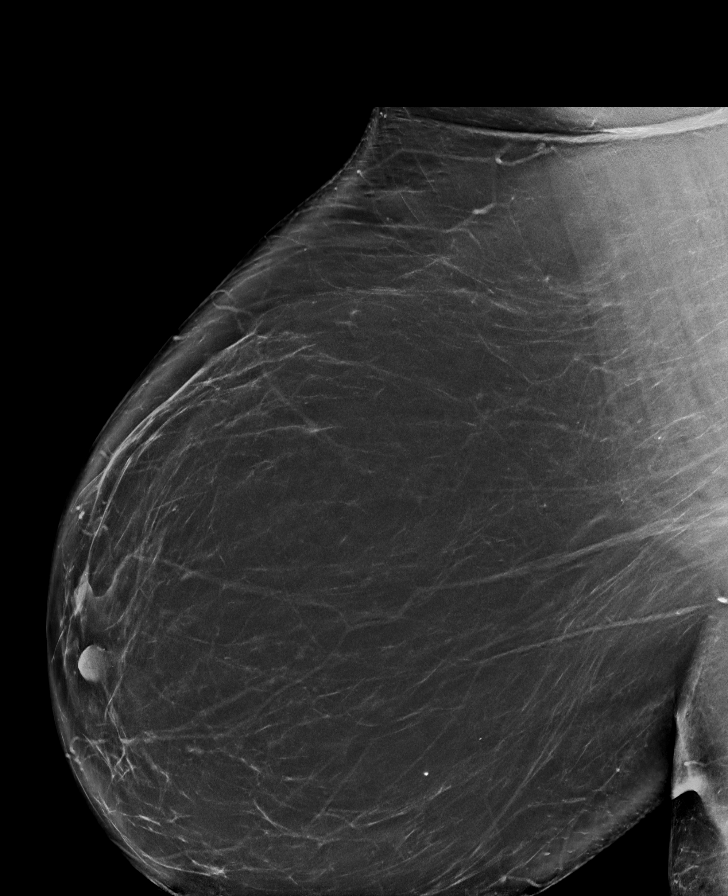

[R CC synth-2D]
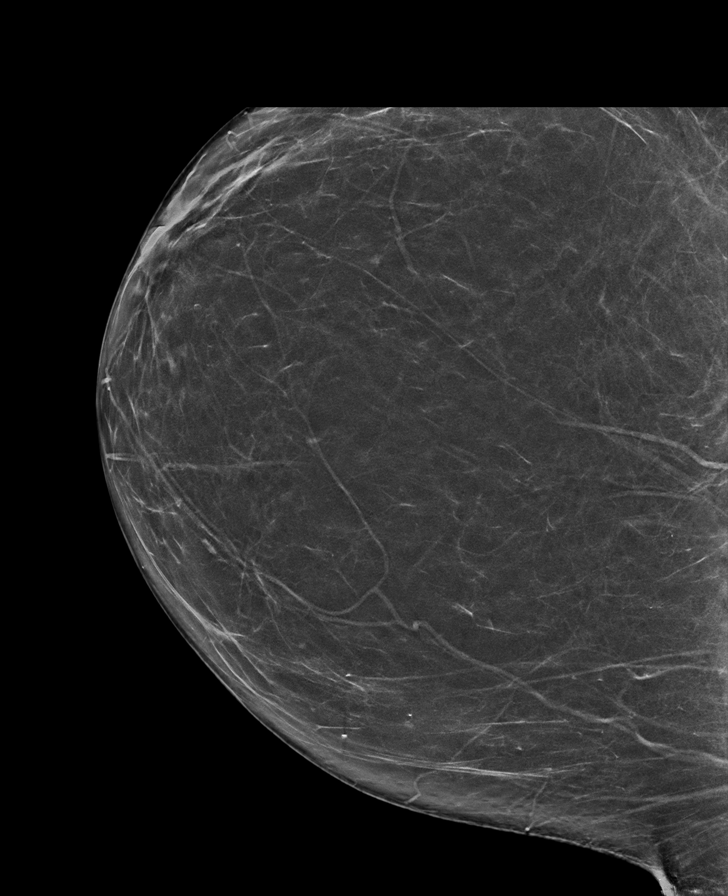

[R MLO synth-2D (2 of 2)]
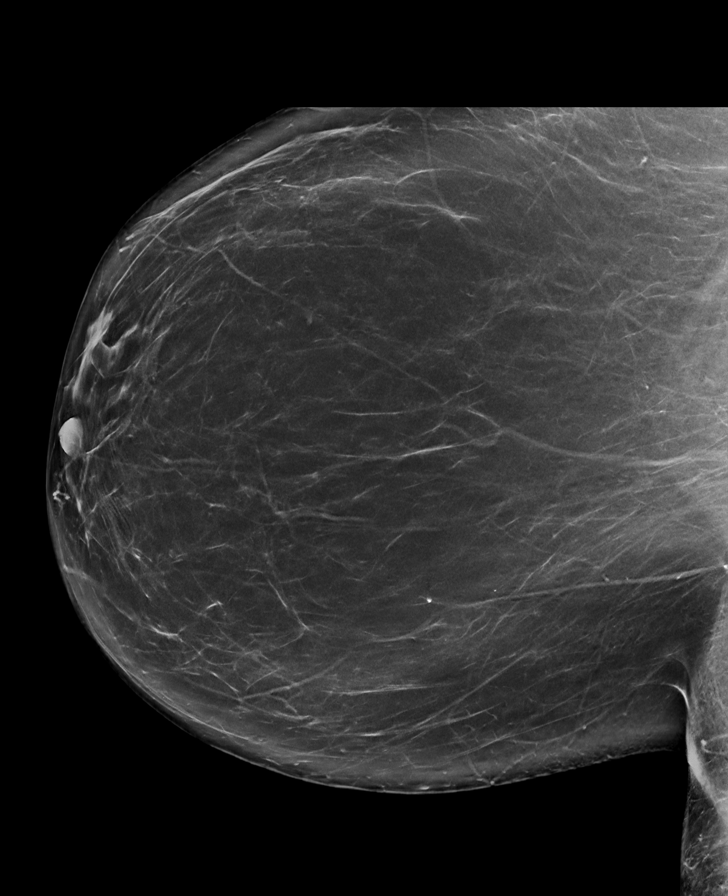

[R XCCL tomo · tomo slice 57/83.0]
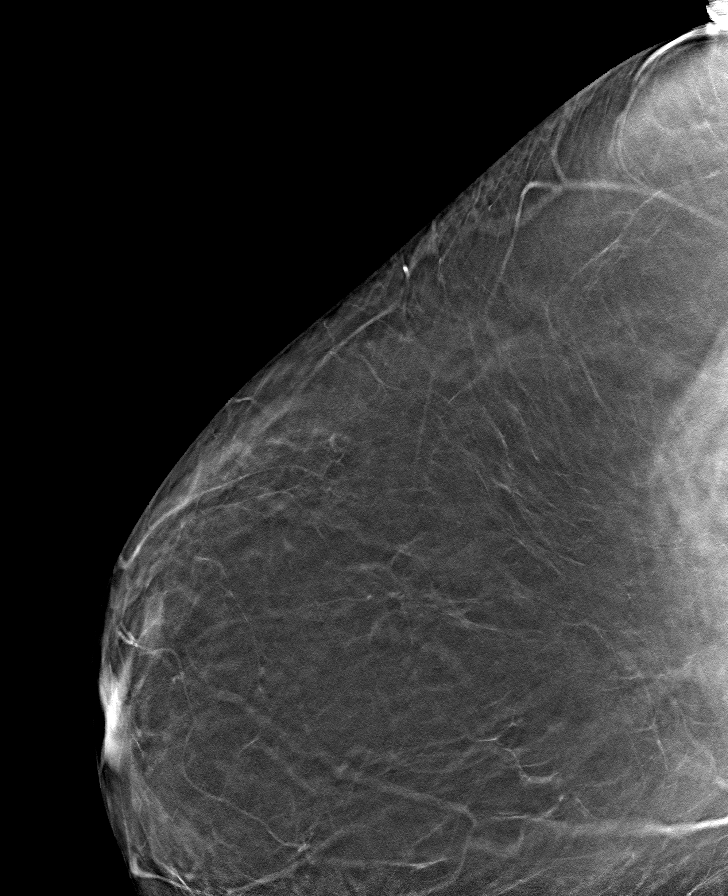

[8 of 40 positions shown; findings below may reference images not displayed]

ACR Breast Density Category b: There are scattered areas of
fibroglandular density.
FINDINGS: In the left breast, possible distortion warrants further evaluation.
In the right breast, no findings suspicious for malignancy. Images
were processed with CAD.
IMPRESSION: Further evaluation is suggested for possible distortion in the left
breast.

RECOMMENDATION:
Diagnostic mammogram and possibly ultrasound of the left breast.
(Code:VF-B-LLX)

The patient will be contacted regarding the findings, and additional
imaging will be scheduled.

BI-RADS CATEGORY  0: Incomplete. Need additional imaging evaluation
and/or prior mammograms for comparison.

## 2020-10-10 DIAGNOSIS — M25571 Pain in right ankle and joints of right foot: Secondary | ICD-10-CM | POA: Diagnosis not present

## 2020-10-10 DIAGNOSIS — M722 Plantar fascial fibromatosis: Secondary | ICD-10-CM | POA: Diagnosis not present

## 2020-10-10 DIAGNOSIS — M9904 Segmental and somatic dysfunction of sacral region: Secondary | ICD-10-CM | POA: Diagnosis not present

## 2020-10-10 DIAGNOSIS — M9903 Segmental and somatic dysfunction of lumbar region: Secondary | ICD-10-CM | POA: Diagnosis not present

## 2020-10-26 DIAGNOSIS — J3089 Other allergic rhinitis: Secondary | ICD-10-CM | POA: Diagnosis not present

## 2020-10-26 DIAGNOSIS — J301 Allergic rhinitis due to pollen: Secondary | ICD-10-CM | POA: Diagnosis not present

## 2020-10-28 DIAGNOSIS — J301 Allergic rhinitis due to pollen: Secondary | ICD-10-CM | POA: Diagnosis not present

## 2020-10-31 DIAGNOSIS — J3089 Other allergic rhinitis: Secondary | ICD-10-CM | POA: Diagnosis not present

## 2020-11-02 DIAGNOSIS — M25571 Pain in right ankle and joints of right foot: Secondary | ICD-10-CM | POA: Diagnosis not present

## 2020-11-02 DIAGNOSIS — M9903 Segmental and somatic dysfunction of lumbar region: Secondary | ICD-10-CM | POA: Diagnosis not present

## 2020-11-02 DIAGNOSIS — M722 Plantar fascial fibromatosis: Secondary | ICD-10-CM | POA: Diagnosis not present

## 2020-11-02 DIAGNOSIS — M9904 Segmental and somatic dysfunction of sacral region: Secondary | ICD-10-CM | POA: Diagnosis not present

## 2020-11-08 DIAGNOSIS — J3089 Other allergic rhinitis: Secondary | ICD-10-CM | POA: Diagnosis not present

## 2020-11-08 DIAGNOSIS — J301 Allergic rhinitis due to pollen: Secondary | ICD-10-CM | POA: Diagnosis not present

## 2020-11-10 DIAGNOSIS — J3089 Other allergic rhinitis: Secondary | ICD-10-CM | POA: Diagnosis not present

## 2020-11-10 DIAGNOSIS — J301 Allergic rhinitis due to pollen: Secondary | ICD-10-CM | POA: Diagnosis not present

## 2020-11-10 DIAGNOSIS — J3081 Allergic rhinitis due to animal (cat) (dog) hair and dander: Secondary | ICD-10-CM | POA: Diagnosis not present

## 2020-11-14 DIAGNOSIS — J3081 Allergic rhinitis due to animal (cat) (dog) hair and dander: Secondary | ICD-10-CM | POA: Diagnosis not present

## 2020-11-14 DIAGNOSIS — J301 Allergic rhinitis due to pollen: Secondary | ICD-10-CM | POA: Diagnosis not present

## 2020-11-14 DIAGNOSIS — J3089 Other allergic rhinitis: Secondary | ICD-10-CM | POA: Diagnosis not present

## 2020-11-16 DIAGNOSIS — J3089 Other allergic rhinitis: Secondary | ICD-10-CM | POA: Diagnosis not present

## 2020-11-16 DIAGNOSIS — J301 Allergic rhinitis due to pollen: Secondary | ICD-10-CM | POA: Diagnosis not present

## 2020-11-16 DIAGNOSIS — J3081 Allergic rhinitis due to animal (cat) (dog) hair and dander: Secondary | ICD-10-CM | POA: Diagnosis not present

## 2020-11-22 DIAGNOSIS — J3089 Other allergic rhinitis: Secondary | ICD-10-CM | POA: Diagnosis not present

## 2020-11-22 DIAGNOSIS — J301 Allergic rhinitis due to pollen: Secondary | ICD-10-CM | POA: Diagnosis not present

## 2020-11-23 DIAGNOSIS — H04123 Dry eye syndrome of bilateral lacrimal glands: Secondary | ICD-10-CM | POA: Diagnosis not present

## 2020-11-23 DIAGNOSIS — H1013 Acute atopic conjunctivitis, bilateral: Secondary | ICD-10-CM | POA: Diagnosis not present

## 2020-11-24 DIAGNOSIS — J301 Allergic rhinitis due to pollen: Secondary | ICD-10-CM | POA: Diagnosis not present

## 2020-11-24 DIAGNOSIS — J3089 Other allergic rhinitis: Secondary | ICD-10-CM | POA: Diagnosis not present

## 2020-11-28 DIAGNOSIS — M9904 Segmental and somatic dysfunction of sacral region: Secondary | ICD-10-CM | POA: Diagnosis not present

## 2020-11-28 DIAGNOSIS — M722 Plantar fascial fibromatosis: Secondary | ICD-10-CM | POA: Diagnosis not present

## 2020-11-28 DIAGNOSIS — M25571 Pain in right ankle and joints of right foot: Secondary | ICD-10-CM | POA: Diagnosis not present

## 2020-11-28 DIAGNOSIS — M9903 Segmental and somatic dysfunction of lumbar region: Secondary | ICD-10-CM | POA: Diagnosis not present

## 2020-11-29 DIAGNOSIS — J301 Allergic rhinitis due to pollen: Secondary | ICD-10-CM | POA: Diagnosis not present

## 2020-11-29 DIAGNOSIS — J3089 Other allergic rhinitis: Secondary | ICD-10-CM | POA: Diagnosis not present

## 2020-12-01 DIAGNOSIS — J301 Allergic rhinitis due to pollen: Secondary | ICD-10-CM | POA: Diagnosis not present

## 2020-12-01 DIAGNOSIS — J3081 Allergic rhinitis due to animal (cat) (dog) hair and dander: Secondary | ICD-10-CM | POA: Diagnosis not present

## 2020-12-01 DIAGNOSIS — J3089 Other allergic rhinitis: Secondary | ICD-10-CM | POA: Diagnosis not present

## 2020-12-07 DIAGNOSIS — J301 Allergic rhinitis due to pollen: Secondary | ICD-10-CM | POA: Diagnosis not present

## 2020-12-07 DIAGNOSIS — J3089 Other allergic rhinitis: Secondary | ICD-10-CM | POA: Diagnosis not present

## 2020-12-09 DIAGNOSIS — J3089 Other allergic rhinitis: Secondary | ICD-10-CM | POA: Diagnosis not present

## 2020-12-09 DIAGNOSIS — J301 Allergic rhinitis due to pollen: Secondary | ICD-10-CM | POA: Diagnosis not present

## 2020-12-15 DIAGNOSIS — J3089 Other allergic rhinitis: Secondary | ICD-10-CM | POA: Diagnosis not present

## 2020-12-15 DIAGNOSIS — J301 Allergic rhinitis due to pollen: Secondary | ICD-10-CM | POA: Diagnosis not present

## 2020-12-19 DIAGNOSIS — M25531 Pain in right wrist: Secondary | ICD-10-CM | POA: Diagnosis not present

## 2020-12-19 DIAGNOSIS — M542 Cervicalgia: Secondary | ICD-10-CM | POA: Diagnosis not present

## 2020-12-19 DIAGNOSIS — M25532 Pain in left wrist: Secondary | ICD-10-CM | POA: Diagnosis not present

## 2020-12-19 DIAGNOSIS — M9901 Segmental and somatic dysfunction of cervical region: Secondary | ICD-10-CM | POA: Diagnosis not present

## 2020-12-20 DIAGNOSIS — J301 Allergic rhinitis due to pollen: Secondary | ICD-10-CM | POA: Diagnosis not present

## 2020-12-20 DIAGNOSIS — J3089 Other allergic rhinitis: Secondary | ICD-10-CM | POA: Diagnosis not present

## 2020-12-22 DIAGNOSIS — J301 Allergic rhinitis due to pollen: Secondary | ICD-10-CM | POA: Diagnosis not present

## 2020-12-22 DIAGNOSIS — J3089 Other allergic rhinitis: Secondary | ICD-10-CM | POA: Diagnosis not present

## 2020-12-26 DIAGNOSIS — J3089 Other allergic rhinitis: Secondary | ICD-10-CM | POA: Diagnosis not present

## 2020-12-26 DIAGNOSIS — J301 Allergic rhinitis due to pollen: Secondary | ICD-10-CM | POA: Diagnosis not present

## 2020-12-28 DIAGNOSIS — J3089 Other allergic rhinitis: Secondary | ICD-10-CM | POA: Diagnosis not present

## 2020-12-28 DIAGNOSIS — J301 Allergic rhinitis due to pollen: Secondary | ICD-10-CM | POA: Diagnosis not present

## 2021-01-10 DIAGNOSIS — M150 Primary generalized (osteo)arthritis: Secondary | ICD-10-CM | POA: Diagnosis not present

## 2021-01-10 DIAGNOSIS — Z79899 Other long term (current) drug therapy: Secondary | ICD-10-CM | POA: Diagnosis not present

## 2021-01-10 DIAGNOSIS — M2550 Pain in unspecified joint: Secondary | ICD-10-CM | POA: Diagnosis not present

## 2021-01-10 DIAGNOSIS — M0600 Rheumatoid arthritis without rheumatoid factor, unspecified site: Secondary | ICD-10-CM | POA: Diagnosis not present

## 2021-01-11 DIAGNOSIS — J301 Allergic rhinitis due to pollen: Secondary | ICD-10-CM | POA: Diagnosis not present

## 2021-01-11 DIAGNOSIS — J3089 Other allergic rhinitis: Secondary | ICD-10-CM | POA: Diagnosis not present

## 2021-01-13 DIAGNOSIS — J301 Allergic rhinitis due to pollen: Secondary | ICD-10-CM | POA: Diagnosis not present

## 2021-01-13 DIAGNOSIS — J3089 Other allergic rhinitis: Secondary | ICD-10-CM | POA: Diagnosis not present

## 2021-01-16 DIAGNOSIS — M542 Cervicalgia: Secondary | ICD-10-CM | POA: Diagnosis not present

## 2021-01-16 DIAGNOSIS — M25532 Pain in left wrist: Secondary | ICD-10-CM | POA: Diagnosis not present

## 2021-01-16 DIAGNOSIS — M25531 Pain in right wrist: Secondary | ICD-10-CM | POA: Diagnosis not present

## 2021-01-16 DIAGNOSIS — M9901 Segmental and somatic dysfunction of cervical region: Secondary | ICD-10-CM | POA: Diagnosis not present

## 2021-01-17 DIAGNOSIS — J3089 Other allergic rhinitis: Secondary | ICD-10-CM | POA: Diagnosis not present

## 2021-01-17 DIAGNOSIS — J301 Allergic rhinitis due to pollen: Secondary | ICD-10-CM | POA: Diagnosis not present

## 2021-01-19 DIAGNOSIS — J301 Allergic rhinitis due to pollen: Secondary | ICD-10-CM | POA: Diagnosis not present

## 2021-01-19 DIAGNOSIS — J3089 Other allergic rhinitis: Secondary | ICD-10-CM | POA: Diagnosis not present

## 2021-01-24 DIAGNOSIS — J3089 Other allergic rhinitis: Secondary | ICD-10-CM | POA: Diagnosis not present

## 2021-01-24 DIAGNOSIS — J301 Allergic rhinitis due to pollen: Secondary | ICD-10-CM | POA: Diagnosis not present

## 2021-01-26 DIAGNOSIS — J301 Allergic rhinitis due to pollen: Secondary | ICD-10-CM | POA: Diagnosis not present

## 2021-01-26 DIAGNOSIS — J3089 Other allergic rhinitis: Secondary | ICD-10-CM | POA: Diagnosis not present

## 2021-01-31 DIAGNOSIS — J301 Allergic rhinitis due to pollen: Secondary | ICD-10-CM | POA: Diagnosis not present

## 2021-01-31 DIAGNOSIS — J3089 Other allergic rhinitis: Secondary | ICD-10-CM | POA: Diagnosis not present

## 2021-02-02 DIAGNOSIS — J3089 Other allergic rhinitis: Secondary | ICD-10-CM | POA: Diagnosis not present

## 2021-02-02 DIAGNOSIS — J301 Allergic rhinitis due to pollen: Secondary | ICD-10-CM | POA: Diagnosis not present

## 2021-02-07 DIAGNOSIS — J301 Allergic rhinitis due to pollen: Secondary | ICD-10-CM | POA: Diagnosis not present

## 2021-02-07 DIAGNOSIS — J3081 Allergic rhinitis due to animal (cat) (dog) hair and dander: Secondary | ICD-10-CM | POA: Diagnosis not present

## 2021-02-07 DIAGNOSIS — J3089 Other allergic rhinitis: Secondary | ICD-10-CM | POA: Diagnosis not present

## 2021-02-09 DIAGNOSIS — J301 Allergic rhinitis due to pollen: Secondary | ICD-10-CM | POA: Diagnosis not present

## 2021-02-09 DIAGNOSIS — J3089 Other allergic rhinitis: Secondary | ICD-10-CM | POA: Diagnosis not present

## 2021-02-13 DIAGNOSIS — M542 Cervicalgia: Secondary | ICD-10-CM | POA: Diagnosis not present

## 2021-02-13 DIAGNOSIS — M25531 Pain in right wrist: Secondary | ICD-10-CM | POA: Diagnosis not present

## 2021-02-13 DIAGNOSIS — M25532 Pain in left wrist: Secondary | ICD-10-CM | POA: Diagnosis not present

## 2021-02-13 DIAGNOSIS — M9901 Segmental and somatic dysfunction of cervical region: Secondary | ICD-10-CM | POA: Diagnosis not present

## 2021-02-15 DIAGNOSIS — J301 Allergic rhinitis due to pollen: Secondary | ICD-10-CM | POA: Diagnosis not present

## 2021-02-15 DIAGNOSIS — J3089 Other allergic rhinitis: Secondary | ICD-10-CM | POA: Diagnosis not present

## 2021-02-21 DIAGNOSIS — J3089 Other allergic rhinitis: Secondary | ICD-10-CM | POA: Diagnosis not present

## 2021-02-21 DIAGNOSIS — J301 Allergic rhinitis due to pollen: Secondary | ICD-10-CM | POA: Diagnosis not present

## 2021-02-24 DIAGNOSIS — J3081 Allergic rhinitis due to animal (cat) (dog) hair and dander: Secondary | ICD-10-CM | POA: Diagnosis not present

## 2021-02-24 DIAGNOSIS — J301 Allergic rhinitis due to pollen: Secondary | ICD-10-CM | POA: Diagnosis not present

## 2021-02-24 DIAGNOSIS — J3089 Other allergic rhinitis: Secondary | ICD-10-CM | POA: Diagnosis not present

## 2021-03-01 DIAGNOSIS — J3089 Other allergic rhinitis: Secondary | ICD-10-CM | POA: Diagnosis not present

## 2021-03-01 DIAGNOSIS — J301 Allergic rhinitis due to pollen: Secondary | ICD-10-CM | POA: Diagnosis not present

## 2021-03-07 DIAGNOSIS — J301 Allergic rhinitis due to pollen: Secondary | ICD-10-CM | POA: Diagnosis not present

## 2021-03-07 DIAGNOSIS — J3089 Other allergic rhinitis: Secondary | ICD-10-CM | POA: Diagnosis not present

## 2021-03-09 DIAGNOSIS — J3089 Other allergic rhinitis: Secondary | ICD-10-CM | POA: Diagnosis not present

## 2021-03-09 DIAGNOSIS — J301 Allergic rhinitis due to pollen: Secondary | ICD-10-CM | POA: Diagnosis not present

## 2021-03-14 DIAGNOSIS — J301 Allergic rhinitis due to pollen: Secondary | ICD-10-CM | POA: Diagnosis not present

## 2021-03-14 DIAGNOSIS — J3089 Other allergic rhinitis: Secondary | ICD-10-CM | POA: Diagnosis not present

## 2021-03-16 DIAGNOSIS — J301 Allergic rhinitis due to pollen: Secondary | ICD-10-CM | POA: Diagnosis not present

## 2021-03-16 DIAGNOSIS — J3089 Other allergic rhinitis: Secondary | ICD-10-CM | POA: Diagnosis not present

## 2021-03-20 DIAGNOSIS — J301 Allergic rhinitis due to pollen: Secondary | ICD-10-CM | POA: Diagnosis not present

## 2021-03-20 DIAGNOSIS — J3089 Other allergic rhinitis: Secondary | ICD-10-CM | POA: Diagnosis not present

## 2021-03-20 DIAGNOSIS — M9901 Segmental and somatic dysfunction of cervical region: Secondary | ICD-10-CM | POA: Diagnosis not present

## 2021-03-20 DIAGNOSIS — M5127 Other intervertebral disc displacement, lumbosacral region: Secondary | ICD-10-CM | POA: Diagnosis not present

## 2021-03-20 DIAGNOSIS — M9902 Segmental and somatic dysfunction of thoracic region: Secondary | ICD-10-CM | POA: Diagnosis not present

## 2021-03-20 DIAGNOSIS — M722 Plantar fascial fibromatosis: Secondary | ICD-10-CM | POA: Diagnosis not present

## 2021-04-05 DIAGNOSIS — J301 Allergic rhinitis due to pollen: Secondary | ICD-10-CM | POA: Diagnosis not present

## 2021-04-05 DIAGNOSIS — J3089 Other allergic rhinitis: Secondary | ICD-10-CM | POA: Diagnosis not present

## 2021-04-12 DIAGNOSIS — J301 Allergic rhinitis due to pollen: Secondary | ICD-10-CM | POA: Diagnosis not present

## 2021-04-12 DIAGNOSIS — J3089 Other allergic rhinitis: Secondary | ICD-10-CM | POA: Diagnosis not present

## 2021-04-19 DIAGNOSIS — J301 Allergic rhinitis due to pollen: Secondary | ICD-10-CM | POA: Diagnosis not present

## 2021-04-19 DIAGNOSIS — J3089 Other allergic rhinitis: Secondary | ICD-10-CM | POA: Diagnosis not present

## 2021-04-25 DIAGNOSIS — J301 Allergic rhinitis due to pollen: Secondary | ICD-10-CM | POA: Diagnosis not present

## 2021-04-25 DIAGNOSIS — J3089 Other allergic rhinitis: Secondary | ICD-10-CM | POA: Diagnosis not present

## 2021-04-28 DIAGNOSIS — M722 Plantar fascial fibromatosis: Secondary | ICD-10-CM | POA: Diagnosis not present

## 2021-04-28 DIAGNOSIS — M9903 Segmental and somatic dysfunction of lumbar region: Secondary | ICD-10-CM | POA: Diagnosis not present

## 2021-04-28 DIAGNOSIS — M25561 Pain in right knee: Secondary | ICD-10-CM | POA: Diagnosis not present

## 2021-04-28 DIAGNOSIS — M9904 Segmental and somatic dysfunction of sacral region: Secondary | ICD-10-CM | POA: Diagnosis not present

## 2021-05-03 DIAGNOSIS — J3089 Other allergic rhinitis: Secondary | ICD-10-CM | POA: Diagnosis not present

## 2021-05-03 DIAGNOSIS — J301 Allergic rhinitis due to pollen: Secondary | ICD-10-CM | POA: Diagnosis not present

## 2021-05-10 DIAGNOSIS — J3089 Other allergic rhinitis: Secondary | ICD-10-CM | POA: Diagnosis not present

## 2021-05-10 DIAGNOSIS — J301 Allergic rhinitis due to pollen: Secondary | ICD-10-CM | POA: Diagnosis not present

## 2021-05-17 DIAGNOSIS — J3089 Other allergic rhinitis: Secondary | ICD-10-CM | POA: Diagnosis not present

## 2021-05-17 DIAGNOSIS — J301 Allergic rhinitis due to pollen: Secondary | ICD-10-CM | POA: Diagnosis not present

## 2021-05-23 DIAGNOSIS — J3089 Other allergic rhinitis: Secondary | ICD-10-CM | POA: Diagnosis not present

## 2021-05-23 DIAGNOSIS — J301 Allergic rhinitis due to pollen: Secondary | ICD-10-CM | POA: Diagnosis not present

## 2021-05-29 DIAGNOSIS — M25531 Pain in right wrist: Secondary | ICD-10-CM | POA: Diagnosis not present

## 2021-05-29 DIAGNOSIS — M5127 Other intervertebral disc displacement, lumbosacral region: Secondary | ICD-10-CM | POA: Diagnosis not present

## 2021-05-29 DIAGNOSIS — M9901 Segmental and somatic dysfunction of cervical region: Secondary | ICD-10-CM | POA: Diagnosis not present

## 2021-05-29 DIAGNOSIS — M9902 Segmental and somatic dysfunction of thoracic region: Secondary | ICD-10-CM | POA: Diagnosis not present

## 2021-05-30 ENCOUNTER — Other Ambulatory Visit: Payer: Self-pay | Admitting: Physician Assistant

## 2021-05-30 DIAGNOSIS — J3089 Other allergic rhinitis: Secondary | ICD-10-CM | POA: Diagnosis not present

## 2021-05-30 DIAGNOSIS — J301 Allergic rhinitis due to pollen: Secondary | ICD-10-CM | POA: Diagnosis not present

## 2021-06-01 DIAGNOSIS — J3089 Other allergic rhinitis: Secondary | ICD-10-CM | POA: Diagnosis not present

## 2021-06-01 DIAGNOSIS — J301 Allergic rhinitis due to pollen: Secondary | ICD-10-CM | POA: Diagnosis not present

## 2021-06-05 DIAGNOSIS — Z0000 Encounter for general adult medical examination without abnormal findings: Secondary | ICD-10-CM | POA: Diagnosis not present

## 2021-06-05 DIAGNOSIS — E785 Hyperlipidemia, unspecified: Secondary | ICD-10-CM | POA: Diagnosis not present

## 2021-06-05 DIAGNOSIS — I10 Essential (primary) hypertension: Secondary | ICD-10-CM | POA: Diagnosis not present

## 2021-06-05 DIAGNOSIS — E559 Vitamin D deficiency, unspecified: Secondary | ICD-10-CM | POA: Diagnosis not present

## 2021-06-06 DIAGNOSIS — J3089 Other allergic rhinitis: Secondary | ICD-10-CM | POA: Diagnosis not present

## 2021-06-06 DIAGNOSIS — J301 Allergic rhinitis due to pollen: Secondary | ICD-10-CM | POA: Diagnosis not present

## 2021-06-12 DIAGNOSIS — J3089 Other allergic rhinitis: Secondary | ICD-10-CM | POA: Diagnosis not present

## 2021-06-14 DIAGNOSIS — J301 Allergic rhinitis due to pollen: Secondary | ICD-10-CM | POA: Diagnosis not present

## 2021-06-14 DIAGNOSIS — J3089 Other allergic rhinitis: Secondary | ICD-10-CM | POA: Diagnosis not present

## 2021-06-21 DIAGNOSIS — J3089 Other allergic rhinitis: Secondary | ICD-10-CM | POA: Diagnosis not present

## 2021-06-21 DIAGNOSIS — J301 Allergic rhinitis due to pollen: Secondary | ICD-10-CM | POA: Diagnosis not present

## 2021-06-28 DIAGNOSIS — J3089 Other allergic rhinitis: Secondary | ICD-10-CM | POA: Diagnosis not present

## 2021-06-28 DIAGNOSIS — J301 Allergic rhinitis due to pollen: Secondary | ICD-10-CM | POA: Diagnosis not present

## 2021-06-30 DIAGNOSIS — U071 COVID-19: Secondary | ICD-10-CM | POA: Diagnosis not present

## 2021-07-11 ENCOUNTER — Other Ambulatory Visit: Payer: Self-pay | Admitting: Family Medicine

## 2021-07-11 DIAGNOSIS — Z1231 Encounter for screening mammogram for malignant neoplasm of breast: Secondary | ICD-10-CM

## 2021-07-12 DIAGNOSIS — M0600 Rheumatoid arthritis without rheumatoid factor, unspecified site: Secondary | ICD-10-CM | POA: Diagnosis not present

## 2021-07-12 DIAGNOSIS — Z79899 Other long term (current) drug therapy: Secondary | ICD-10-CM | POA: Diagnosis not present

## 2021-07-12 DIAGNOSIS — M2550 Pain in unspecified joint: Secondary | ICD-10-CM | POA: Diagnosis not present

## 2021-07-12 DIAGNOSIS — M150 Primary generalized (osteo)arthritis: Secondary | ICD-10-CM | POA: Diagnosis not present

## 2021-07-13 DIAGNOSIS — J301 Allergic rhinitis due to pollen: Secondary | ICD-10-CM | POA: Diagnosis not present

## 2021-07-13 DIAGNOSIS — J3089 Other allergic rhinitis: Secondary | ICD-10-CM | POA: Diagnosis not present

## 2021-07-17 DIAGNOSIS — M9901 Segmental and somatic dysfunction of cervical region: Secondary | ICD-10-CM | POA: Diagnosis not present

## 2021-07-17 DIAGNOSIS — J3089 Other allergic rhinitis: Secondary | ICD-10-CM | POA: Diagnosis not present

## 2021-07-17 DIAGNOSIS — M5127 Other intervertebral disc displacement, lumbosacral region: Secondary | ICD-10-CM | POA: Diagnosis not present

## 2021-07-17 DIAGNOSIS — J301 Allergic rhinitis due to pollen: Secondary | ICD-10-CM | POA: Diagnosis not present

## 2021-07-17 DIAGNOSIS — M9902 Segmental and somatic dysfunction of thoracic region: Secondary | ICD-10-CM | POA: Diagnosis not present

## 2021-07-17 DIAGNOSIS — J3081 Allergic rhinitis due to animal (cat) (dog) hair and dander: Secondary | ICD-10-CM | POA: Diagnosis not present

## 2021-07-17 DIAGNOSIS — M25531 Pain in right wrist: Secondary | ICD-10-CM | POA: Diagnosis not present

## 2021-07-20 DIAGNOSIS — J3089 Other allergic rhinitis: Secondary | ICD-10-CM | POA: Diagnosis not present

## 2021-07-20 DIAGNOSIS — J301 Allergic rhinitis due to pollen: Secondary | ICD-10-CM | POA: Diagnosis not present

## 2021-07-26 DIAGNOSIS — J3089 Other allergic rhinitis: Secondary | ICD-10-CM | POA: Diagnosis not present

## 2021-07-26 DIAGNOSIS — J301 Allergic rhinitis due to pollen: Secondary | ICD-10-CM | POA: Diagnosis not present

## 2021-08-02 DIAGNOSIS — J3089 Other allergic rhinitis: Secondary | ICD-10-CM | POA: Diagnosis not present

## 2021-08-02 DIAGNOSIS — J301 Allergic rhinitis due to pollen: Secondary | ICD-10-CM | POA: Diagnosis not present

## 2021-08-09 DIAGNOSIS — J301 Allergic rhinitis due to pollen: Secondary | ICD-10-CM | POA: Diagnosis not present

## 2021-08-09 DIAGNOSIS — J3089 Other allergic rhinitis: Secondary | ICD-10-CM | POA: Diagnosis not present

## 2021-09-05 ENCOUNTER — Ambulatory Visit
Admission: RE | Admit: 2021-09-05 | Discharge: 2021-09-05 | Disposition: A | Discharge status: Home / Self Care | Payer: BC Managed Care – PPO | Source: Ambulatory Visit

## 2021-09-05 ENCOUNTER — Other Ambulatory Visit: Payer: Self-pay

## 2021-09-05 DIAGNOSIS — Z1231 Encounter for screening mammogram for malignant neoplasm of breast: Secondary | ICD-10-CM

## 2021-09-06 DIAGNOSIS — J3089 Other allergic rhinitis: Secondary | ICD-10-CM | POA: Diagnosis not present

## 2021-09-06 DIAGNOSIS — J301 Allergic rhinitis due to pollen: Secondary | ICD-10-CM | POA: Diagnosis not present

## 2021-09-11 DIAGNOSIS — M9903 Segmental and somatic dysfunction of lumbar region: Secondary | ICD-10-CM | POA: Diagnosis not present

## 2021-09-11 DIAGNOSIS — M9904 Segmental and somatic dysfunction of sacral region: Secondary | ICD-10-CM | POA: Diagnosis not present

## 2021-09-11 DIAGNOSIS — M25571 Pain in right ankle and joints of right foot: Secondary | ICD-10-CM | POA: Diagnosis not present

## 2021-09-11 DIAGNOSIS — M9901 Segmental and somatic dysfunction of cervical region: Secondary | ICD-10-CM | POA: Diagnosis not present

## 2021-09-13 DIAGNOSIS — J3089 Other allergic rhinitis: Secondary | ICD-10-CM | POA: Diagnosis not present

## 2021-09-13 DIAGNOSIS — J301 Allergic rhinitis due to pollen: Secondary | ICD-10-CM | POA: Diagnosis not present

## 2021-09-20 DIAGNOSIS — J301 Allergic rhinitis due to pollen: Secondary | ICD-10-CM | POA: Diagnosis not present

## 2021-09-20 DIAGNOSIS — J3089 Other allergic rhinitis: Secondary | ICD-10-CM | POA: Diagnosis not present

## 2021-09-27 DIAGNOSIS — J3089 Other allergic rhinitis: Secondary | ICD-10-CM | POA: Diagnosis not present

## 2021-09-27 DIAGNOSIS — J301 Allergic rhinitis due to pollen: Secondary | ICD-10-CM | POA: Diagnosis not present

## 2021-10-05 DIAGNOSIS — J3089 Other allergic rhinitis: Secondary | ICD-10-CM | POA: Diagnosis not present

## 2021-10-05 DIAGNOSIS — J301 Allergic rhinitis due to pollen: Secondary | ICD-10-CM | POA: Diagnosis not present

## 2021-10-09 DIAGNOSIS — M9903 Segmental and somatic dysfunction of lumbar region: Secondary | ICD-10-CM | POA: Diagnosis not present

## 2021-10-09 DIAGNOSIS — M25571 Pain in right ankle and joints of right foot: Secondary | ICD-10-CM | POA: Diagnosis not present

## 2021-10-09 DIAGNOSIS — M9901 Segmental and somatic dysfunction of cervical region: Secondary | ICD-10-CM | POA: Diagnosis not present

## 2021-10-09 DIAGNOSIS — M9904 Segmental and somatic dysfunction of sacral region: Secondary | ICD-10-CM | POA: Diagnosis not present

## 2021-10-11 DIAGNOSIS — J301 Allergic rhinitis due to pollen: Secondary | ICD-10-CM | POA: Diagnosis not present

## 2021-10-11 DIAGNOSIS — J3089 Other allergic rhinitis: Secondary | ICD-10-CM | POA: Diagnosis not present

## 2021-10-18 DIAGNOSIS — J3089 Other allergic rhinitis: Secondary | ICD-10-CM | POA: Diagnosis not present

## 2021-10-18 DIAGNOSIS — J301 Allergic rhinitis due to pollen: Secondary | ICD-10-CM | POA: Diagnosis not present

## 2021-10-25 DIAGNOSIS — J3089 Other allergic rhinitis: Secondary | ICD-10-CM | POA: Diagnosis not present

## 2021-10-25 DIAGNOSIS — J301 Allergic rhinitis due to pollen: Secondary | ICD-10-CM | POA: Diagnosis not present

## 2021-11-02 DIAGNOSIS — J301 Allergic rhinitis due to pollen: Secondary | ICD-10-CM | POA: Diagnosis not present

## 2021-11-02 DIAGNOSIS — J3089 Other allergic rhinitis: Secondary | ICD-10-CM | POA: Diagnosis not present

## 2021-11-06 DIAGNOSIS — M25572 Pain in left ankle and joints of left foot: Secondary | ICD-10-CM | POA: Diagnosis not present

## 2021-11-06 DIAGNOSIS — M9901 Segmental and somatic dysfunction of cervical region: Secondary | ICD-10-CM | POA: Diagnosis not present

## 2021-11-06 DIAGNOSIS — M9904 Segmental and somatic dysfunction of sacral region: Secondary | ICD-10-CM | POA: Diagnosis not present

## 2021-11-06 DIAGNOSIS — M9903 Segmental and somatic dysfunction of lumbar region: Secondary | ICD-10-CM | POA: Diagnosis not present

## 2021-11-08 DIAGNOSIS — J301 Allergic rhinitis due to pollen: Secondary | ICD-10-CM | POA: Diagnosis not present

## 2021-11-08 DIAGNOSIS — J3089 Other allergic rhinitis: Secondary | ICD-10-CM | POA: Diagnosis not present

## 2021-11-15 DIAGNOSIS — J3089 Other allergic rhinitis: Secondary | ICD-10-CM | POA: Diagnosis not present

## 2021-11-15 DIAGNOSIS — J301 Allergic rhinitis due to pollen: Secondary | ICD-10-CM | POA: Diagnosis not present

## 2021-11-22 DIAGNOSIS — J301 Allergic rhinitis due to pollen: Secondary | ICD-10-CM | POA: Diagnosis not present

## 2021-11-22 DIAGNOSIS — J3089 Other allergic rhinitis: Secondary | ICD-10-CM | POA: Diagnosis not present

## 2021-11-27 DIAGNOSIS — H04123 Dry eye syndrome of bilateral lacrimal glands: Secondary | ICD-10-CM | POA: Diagnosis not present

## 2021-11-27 DIAGNOSIS — H1013 Acute atopic conjunctivitis, bilateral: Secondary | ICD-10-CM | POA: Diagnosis not present

## 2021-11-27 DIAGNOSIS — H5213 Myopia, bilateral: Secondary | ICD-10-CM | POA: Diagnosis not present

## 2021-11-29 DIAGNOSIS — J3089 Other allergic rhinitis: Secondary | ICD-10-CM | POA: Diagnosis not present

## 2021-11-29 DIAGNOSIS — J301 Allergic rhinitis due to pollen: Secondary | ICD-10-CM | POA: Diagnosis not present

## 2021-12-04 DIAGNOSIS — M9901 Segmental and somatic dysfunction of cervical region: Secondary | ICD-10-CM | POA: Diagnosis not present

## 2021-12-04 DIAGNOSIS — M9903 Segmental and somatic dysfunction of lumbar region: Secondary | ICD-10-CM | POA: Diagnosis not present

## 2021-12-04 DIAGNOSIS — M25572 Pain in left ankle and joints of left foot: Secondary | ICD-10-CM | POA: Diagnosis not present

## 2021-12-04 DIAGNOSIS — M9904 Segmental and somatic dysfunction of sacral region: Secondary | ICD-10-CM | POA: Diagnosis not present

## 2021-12-05 DIAGNOSIS — J301 Allergic rhinitis due to pollen: Secondary | ICD-10-CM | POA: Diagnosis not present

## 2021-12-05 DIAGNOSIS — J3089 Other allergic rhinitis: Secondary | ICD-10-CM | POA: Diagnosis not present

## 2021-12-07 DIAGNOSIS — I10 Essential (primary) hypertension: Secondary | ICD-10-CM | POA: Diagnosis not present

## 2021-12-07 DIAGNOSIS — F411 Generalized anxiety disorder: Secondary | ICD-10-CM | POA: Diagnosis not present

## 2021-12-07 DIAGNOSIS — E785 Hyperlipidemia, unspecified: Secondary | ICD-10-CM | POA: Diagnosis not present

## 2021-12-14 DIAGNOSIS — J301 Allergic rhinitis due to pollen: Secondary | ICD-10-CM | POA: Diagnosis not present

## 2021-12-14 DIAGNOSIS — J3089 Other allergic rhinitis: Secondary | ICD-10-CM | POA: Diagnosis not present

## 2021-12-20 DIAGNOSIS — J301 Allergic rhinitis due to pollen: Secondary | ICD-10-CM | POA: Diagnosis not present

## 2021-12-20 DIAGNOSIS — J3089 Other allergic rhinitis: Secondary | ICD-10-CM | POA: Diagnosis not present

## 2021-12-27 DIAGNOSIS — J301 Allergic rhinitis due to pollen: Secondary | ICD-10-CM | POA: Diagnosis not present

## 2021-12-27 DIAGNOSIS — M9902 Segmental and somatic dysfunction of thoracic region: Secondary | ICD-10-CM | POA: Diagnosis not present

## 2021-12-27 DIAGNOSIS — M76821 Posterior tibial tendinitis, right leg: Secondary | ICD-10-CM | POA: Diagnosis not present

## 2021-12-27 DIAGNOSIS — M9901 Segmental and somatic dysfunction of cervical region: Secondary | ICD-10-CM | POA: Diagnosis not present

## 2021-12-27 DIAGNOSIS — M5030 Other cervical disc degeneration, unspecified cervical region: Secondary | ICD-10-CM | POA: Diagnosis not present

## 2021-12-29 DIAGNOSIS — J3089 Other allergic rhinitis: Secondary | ICD-10-CM | POA: Diagnosis not present

## 2022-01-03 DIAGNOSIS — J3089 Other allergic rhinitis: Secondary | ICD-10-CM | POA: Diagnosis not present

## 2022-01-03 DIAGNOSIS — J301 Allergic rhinitis due to pollen: Secondary | ICD-10-CM | POA: Diagnosis not present

## 2022-01-10 DIAGNOSIS — J301 Allergic rhinitis due to pollen: Secondary | ICD-10-CM | POA: Diagnosis not present

## 2022-01-10 DIAGNOSIS — M0600 Rheumatoid arthritis without rheumatoid factor, unspecified site: Secondary | ICD-10-CM | POA: Diagnosis not present

## 2022-01-10 DIAGNOSIS — M150 Primary generalized (osteo)arthritis: Secondary | ICD-10-CM | POA: Diagnosis not present

## 2022-01-10 DIAGNOSIS — Z79899 Other long term (current) drug therapy: Secondary | ICD-10-CM | POA: Diagnosis not present

## 2022-01-10 DIAGNOSIS — J3089 Other allergic rhinitis: Secondary | ICD-10-CM | POA: Diagnosis not present

## 2022-01-10 DIAGNOSIS — M2550 Pain in unspecified joint: Secondary | ICD-10-CM | POA: Diagnosis not present

## 2022-01-10 DIAGNOSIS — R5383 Other fatigue: Secondary | ICD-10-CM | POA: Diagnosis not present

## 2022-01-18 DIAGNOSIS — J301 Allergic rhinitis due to pollen: Secondary | ICD-10-CM | POA: Diagnosis not present

## 2022-01-18 DIAGNOSIS — J3089 Other allergic rhinitis: Secondary | ICD-10-CM | POA: Diagnosis not present

## 2022-01-24 DIAGNOSIS — J3081 Allergic rhinitis due to animal (cat) (dog) hair and dander: Secondary | ICD-10-CM | POA: Diagnosis not present

## 2022-01-24 DIAGNOSIS — J301 Allergic rhinitis due to pollen: Secondary | ICD-10-CM | POA: Diagnosis not present

## 2022-01-24 DIAGNOSIS — J3089 Other allergic rhinitis: Secondary | ICD-10-CM | POA: Diagnosis not present

## 2022-01-31 DIAGNOSIS — J301 Allergic rhinitis due to pollen: Secondary | ICD-10-CM | POA: Diagnosis not present

## 2022-01-31 DIAGNOSIS — J3089 Other allergic rhinitis: Secondary | ICD-10-CM | POA: Diagnosis not present

## 2022-02-05 DIAGNOSIS — M9902 Segmental and somatic dysfunction of thoracic region: Secondary | ICD-10-CM | POA: Diagnosis not present

## 2022-02-05 DIAGNOSIS — M76821 Posterior tibial tendinitis, right leg: Secondary | ICD-10-CM | POA: Diagnosis not present

## 2022-02-05 DIAGNOSIS — M5030 Other cervical disc degeneration, unspecified cervical region: Secondary | ICD-10-CM | POA: Diagnosis not present

## 2022-02-05 DIAGNOSIS — M9901 Segmental and somatic dysfunction of cervical region: Secondary | ICD-10-CM | POA: Diagnosis not present

## 2022-02-08 DIAGNOSIS — J3089 Other allergic rhinitis: Secondary | ICD-10-CM | POA: Diagnosis not present

## 2022-02-08 DIAGNOSIS — J301 Allergic rhinitis due to pollen: Secondary | ICD-10-CM | POA: Diagnosis not present

## 2022-02-15 DIAGNOSIS — J3089 Other allergic rhinitis: Secondary | ICD-10-CM | POA: Diagnosis not present

## 2022-02-15 DIAGNOSIS — J3081 Allergic rhinitis due to animal (cat) (dog) hair and dander: Secondary | ICD-10-CM | POA: Diagnosis not present

## 2022-02-15 DIAGNOSIS — J301 Allergic rhinitis due to pollen: Secondary | ICD-10-CM | POA: Diagnosis not present

## 2022-02-26 DIAGNOSIS — M5030 Other cervical disc degeneration, unspecified cervical region: Secondary | ICD-10-CM | POA: Diagnosis not present

## 2022-02-26 DIAGNOSIS — M9902 Segmental and somatic dysfunction of thoracic region: Secondary | ICD-10-CM | POA: Diagnosis not present

## 2022-02-26 DIAGNOSIS — M9901 Segmental and somatic dysfunction of cervical region: Secondary | ICD-10-CM | POA: Diagnosis not present

## 2022-02-26 DIAGNOSIS — M76821 Posterior tibial tendinitis, right leg: Secondary | ICD-10-CM | POA: Diagnosis not present

## 2022-02-28 DIAGNOSIS — J301 Allergic rhinitis due to pollen: Secondary | ICD-10-CM | POA: Diagnosis not present

## 2022-02-28 DIAGNOSIS — J3089 Other allergic rhinitis: Secondary | ICD-10-CM | POA: Diagnosis not present

## 2022-03-06 DIAGNOSIS — J3089 Other allergic rhinitis: Secondary | ICD-10-CM | POA: Diagnosis not present

## 2022-03-06 DIAGNOSIS — J301 Allergic rhinitis due to pollen: Secondary | ICD-10-CM | POA: Diagnosis not present

## 2022-03-19 DIAGNOSIS — M9901 Segmental and somatic dysfunction of cervical region: Secondary | ICD-10-CM | POA: Diagnosis not present

## 2022-03-19 DIAGNOSIS — M9904 Segmental and somatic dysfunction of sacral region: Secondary | ICD-10-CM | POA: Diagnosis not present

## 2022-03-19 DIAGNOSIS — M5136 Other intervertebral disc degeneration, lumbar region: Secondary | ICD-10-CM | POA: Diagnosis not present

## 2022-03-19 DIAGNOSIS — M9903 Segmental and somatic dysfunction of lumbar region: Secondary | ICD-10-CM | POA: Diagnosis not present

## 2022-03-22 DIAGNOSIS — J3089 Other allergic rhinitis: Secondary | ICD-10-CM | POA: Diagnosis not present

## 2022-03-22 DIAGNOSIS — J301 Allergic rhinitis due to pollen: Secondary | ICD-10-CM | POA: Diagnosis not present

## 2022-04-03 DIAGNOSIS — J301 Allergic rhinitis due to pollen: Secondary | ICD-10-CM | POA: Diagnosis not present

## 2022-04-05 DIAGNOSIS — J301 Allergic rhinitis due to pollen: Secondary | ICD-10-CM | POA: Diagnosis not present

## 2022-04-05 DIAGNOSIS — J3089 Other allergic rhinitis: Secondary | ICD-10-CM | POA: Diagnosis not present

## 2022-04-12 DIAGNOSIS — J3081 Allergic rhinitis due to animal (cat) (dog) hair and dander: Secondary | ICD-10-CM | POA: Diagnosis not present

## 2022-04-12 DIAGNOSIS — J301 Allergic rhinitis due to pollen: Secondary | ICD-10-CM | POA: Diagnosis not present

## 2022-04-12 DIAGNOSIS — J3089 Other allergic rhinitis: Secondary | ICD-10-CM | POA: Diagnosis not present

## 2022-04-23 DIAGNOSIS — M9901 Segmental and somatic dysfunction of cervical region: Secondary | ICD-10-CM | POA: Diagnosis not present

## 2022-04-23 DIAGNOSIS — M9904 Segmental and somatic dysfunction of sacral region: Secondary | ICD-10-CM | POA: Diagnosis not present

## 2022-04-23 DIAGNOSIS — M9903 Segmental and somatic dysfunction of lumbar region: Secondary | ICD-10-CM | POA: Diagnosis not present

## 2022-04-23 DIAGNOSIS — M5136 Other intervertebral disc degeneration, lumbar region: Secondary | ICD-10-CM | POA: Diagnosis not present

## 2022-04-25 DIAGNOSIS — J3081 Allergic rhinitis due to animal (cat) (dog) hair and dander: Secondary | ICD-10-CM | POA: Diagnosis not present

## 2022-04-25 DIAGNOSIS — J301 Allergic rhinitis due to pollen: Secondary | ICD-10-CM | POA: Diagnosis not present

## 2022-04-25 DIAGNOSIS — J3089 Other allergic rhinitis: Secondary | ICD-10-CM | POA: Diagnosis not present

## 2022-05-02 DIAGNOSIS — J3081 Allergic rhinitis due to animal (cat) (dog) hair and dander: Secondary | ICD-10-CM | POA: Diagnosis not present

## 2022-05-02 DIAGNOSIS — J3089 Other allergic rhinitis: Secondary | ICD-10-CM | POA: Diagnosis not present

## 2022-05-02 DIAGNOSIS — J301 Allergic rhinitis due to pollen: Secondary | ICD-10-CM | POA: Diagnosis not present

## 2022-05-16 DIAGNOSIS — J301 Allergic rhinitis due to pollen: Secondary | ICD-10-CM | POA: Diagnosis not present

## 2022-05-16 DIAGNOSIS — J3089 Other allergic rhinitis: Secondary | ICD-10-CM | POA: Diagnosis not present

## 2022-05-16 DIAGNOSIS — J3081 Allergic rhinitis due to animal (cat) (dog) hair and dander: Secondary | ICD-10-CM | POA: Diagnosis not present

## 2022-05-30 DIAGNOSIS — M9901 Segmental and somatic dysfunction of cervical region: Secondary | ICD-10-CM | POA: Diagnosis not present

## 2022-05-30 DIAGNOSIS — M9904 Segmental and somatic dysfunction of sacral region: Secondary | ICD-10-CM | POA: Diagnosis not present

## 2022-05-30 DIAGNOSIS — M9903 Segmental and somatic dysfunction of lumbar region: Secondary | ICD-10-CM | POA: Diagnosis not present

## 2022-05-30 DIAGNOSIS — M25572 Pain in left ankle and joints of left foot: Secondary | ICD-10-CM | POA: Diagnosis not present

## 2022-06-06 DIAGNOSIS — J3089 Other allergic rhinitis: Secondary | ICD-10-CM | POA: Diagnosis not present

## 2022-06-06 DIAGNOSIS — J301 Allergic rhinitis due to pollen: Secondary | ICD-10-CM | POA: Diagnosis not present

## 2022-06-11 DIAGNOSIS — K219 Gastro-esophageal reflux disease without esophagitis: Secondary | ICD-10-CM | POA: Diagnosis not present

## 2022-06-11 DIAGNOSIS — F411 Generalized anxiety disorder: Secondary | ICD-10-CM | POA: Diagnosis not present

## 2022-06-11 DIAGNOSIS — I10 Essential (primary) hypertension: Secondary | ICD-10-CM | POA: Diagnosis not present

## 2022-06-11 DIAGNOSIS — Z0000 Encounter for general adult medical examination without abnormal findings: Secondary | ICD-10-CM | POA: Diagnosis not present

## 2022-06-11 DIAGNOSIS — E785 Hyperlipidemia, unspecified: Secondary | ICD-10-CM | POA: Diagnosis not present

## 2022-06-14 DIAGNOSIS — J301 Allergic rhinitis due to pollen: Secondary | ICD-10-CM | POA: Diagnosis not present

## 2022-06-14 DIAGNOSIS — J3081 Allergic rhinitis due to animal (cat) (dog) hair and dander: Secondary | ICD-10-CM | POA: Diagnosis not present

## 2022-06-14 DIAGNOSIS — J3089 Other allergic rhinitis: Secondary | ICD-10-CM | POA: Diagnosis not present

## 2022-06-20 DIAGNOSIS — J3089 Other allergic rhinitis: Secondary | ICD-10-CM | POA: Diagnosis not present

## 2022-06-20 DIAGNOSIS — J301 Allergic rhinitis due to pollen: Secondary | ICD-10-CM | POA: Diagnosis not present

## 2022-06-23 ENCOUNTER — Other Ambulatory Visit: Payer: Self-pay | Admitting: Physician Assistant

## 2022-06-25 DIAGNOSIS — M25572 Pain in left ankle and joints of left foot: Secondary | ICD-10-CM | POA: Diagnosis not present

## 2022-06-25 DIAGNOSIS — M9904 Segmental and somatic dysfunction of sacral region: Secondary | ICD-10-CM | POA: Diagnosis not present

## 2022-06-25 DIAGNOSIS — M9901 Segmental and somatic dysfunction of cervical region: Secondary | ICD-10-CM | POA: Diagnosis not present

## 2022-06-25 DIAGNOSIS — M9903 Segmental and somatic dysfunction of lumbar region: Secondary | ICD-10-CM | POA: Diagnosis not present

## 2022-06-27 DIAGNOSIS — J3081 Allergic rhinitis due to animal (cat) (dog) hair and dander: Secondary | ICD-10-CM | POA: Diagnosis not present

## 2022-06-27 DIAGNOSIS — J3089 Other allergic rhinitis: Secondary | ICD-10-CM | POA: Diagnosis not present

## 2022-06-27 DIAGNOSIS — J301 Allergic rhinitis due to pollen: Secondary | ICD-10-CM | POA: Diagnosis not present

## 2022-07-04 DIAGNOSIS — J3089 Other allergic rhinitis: Secondary | ICD-10-CM | POA: Diagnosis not present

## 2022-07-04 DIAGNOSIS — J301 Allergic rhinitis due to pollen: Secondary | ICD-10-CM | POA: Diagnosis not present

## 2022-07-10 DIAGNOSIS — J3081 Allergic rhinitis due to animal (cat) (dog) hair and dander: Secondary | ICD-10-CM | POA: Diagnosis not present

## 2022-07-10 DIAGNOSIS — M1991 Primary osteoarthritis, unspecified site: Secondary | ICD-10-CM | POA: Diagnosis not present

## 2022-07-10 DIAGNOSIS — J301 Allergic rhinitis due to pollen: Secondary | ICD-10-CM | POA: Diagnosis not present

## 2022-07-10 DIAGNOSIS — J3089 Other allergic rhinitis: Secondary | ICD-10-CM | POA: Diagnosis not present

## 2022-07-10 DIAGNOSIS — M0600 Rheumatoid arthritis without rheumatoid factor, unspecified site: Secondary | ICD-10-CM | POA: Diagnosis not present

## 2022-07-10 DIAGNOSIS — Z79899 Other long term (current) drug therapy: Secondary | ICD-10-CM | POA: Diagnosis not present

## 2022-07-10 DIAGNOSIS — R21 Rash and other nonspecific skin eruption: Secondary | ICD-10-CM | POA: Diagnosis not present

## 2022-07-19 DIAGNOSIS — J3081 Allergic rhinitis due to animal (cat) (dog) hair and dander: Secondary | ICD-10-CM | POA: Diagnosis not present

## 2022-07-19 DIAGNOSIS — J301 Allergic rhinitis due to pollen: Secondary | ICD-10-CM | POA: Diagnosis not present

## 2022-07-19 DIAGNOSIS — J3089 Other allergic rhinitis: Secondary | ICD-10-CM | POA: Diagnosis not present

## 2022-07-25 ENCOUNTER — Other Ambulatory Visit: Payer: Self-pay | Admitting: Family Medicine

## 2022-07-25 DIAGNOSIS — M25572 Pain in left ankle and joints of left foot: Secondary | ICD-10-CM | POA: Diagnosis not present

## 2022-07-25 DIAGNOSIS — M9904 Segmental and somatic dysfunction of sacral region: Secondary | ICD-10-CM | POA: Diagnosis not present

## 2022-07-25 DIAGNOSIS — M9901 Segmental and somatic dysfunction of cervical region: Secondary | ICD-10-CM | POA: Diagnosis not present

## 2022-07-25 DIAGNOSIS — M9903 Segmental and somatic dysfunction of lumbar region: Secondary | ICD-10-CM | POA: Diagnosis not present

## 2022-07-25 DIAGNOSIS — Z1231 Encounter for screening mammogram for malignant neoplasm of breast: Secondary | ICD-10-CM

## 2022-08-01 DIAGNOSIS — J3081 Allergic rhinitis due to animal (cat) (dog) hair and dander: Secondary | ICD-10-CM | POA: Diagnosis not present

## 2022-08-01 DIAGNOSIS — J3089 Other allergic rhinitis: Secondary | ICD-10-CM | POA: Diagnosis not present

## 2022-08-01 DIAGNOSIS — J301 Allergic rhinitis due to pollen: Secondary | ICD-10-CM | POA: Diagnosis not present

## 2022-08-02 DIAGNOSIS — J3089 Other allergic rhinitis: Secondary | ICD-10-CM | POA: Diagnosis not present

## 2022-08-08 DIAGNOSIS — J3081 Allergic rhinitis due to animal (cat) (dog) hair and dander: Secondary | ICD-10-CM | POA: Diagnosis not present

## 2022-08-08 DIAGNOSIS — J3089 Other allergic rhinitis: Secondary | ICD-10-CM | POA: Diagnosis not present

## 2022-08-08 DIAGNOSIS — J301 Allergic rhinitis due to pollen: Secondary | ICD-10-CM | POA: Diagnosis not present

## 2022-08-13 DIAGNOSIS — M9904 Segmental and somatic dysfunction of sacral region: Secondary | ICD-10-CM | POA: Diagnosis not present

## 2022-08-13 DIAGNOSIS — M9903 Segmental and somatic dysfunction of lumbar region: Secondary | ICD-10-CM | POA: Diagnosis not present

## 2022-08-13 DIAGNOSIS — M25572 Pain in left ankle and joints of left foot: Secondary | ICD-10-CM | POA: Diagnosis not present

## 2022-08-13 DIAGNOSIS — M9901 Segmental and somatic dysfunction of cervical region: Secondary | ICD-10-CM | POA: Diagnosis not present

## 2022-08-15 DIAGNOSIS — I872 Venous insufficiency (chronic) (peripheral): Secondary | ICD-10-CM | POA: Diagnosis not present

## 2022-08-15 DIAGNOSIS — B078 Other viral warts: Secondary | ICD-10-CM | POA: Diagnosis not present

## 2022-08-29 DIAGNOSIS — J3089 Other allergic rhinitis: Secondary | ICD-10-CM | POA: Diagnosis not present

## 2022-08-29 DIAGNOSIS — J3081 Allergic rhinitis due to animal (cat) (dog) hair and dander: Secondary | ICD-10-CM | POA: Diagnosis not present

## 2022-09-10 ENCOUNTER — Ambulatory Visit
Admission: RE | Admit: 2022-09-10 | Discharge: 2022-09-10 | Disposition: A | Discharge status: Home / Self Care | Payer: BC Managed Care – PPO | Source: Ambulatory Visit

## 2022-09-10 DIAGNOSIS — Z1231 Encounter for screening mammogram for malignant neoplasm of breast: Secondary | ICD-10-CM

## 2022-09-12 DIAGNOSIS — M9901 Segmental and somatic dysfunction of cervical region: Secondary | ICD-10-CM | POA: Diagnosis not present

## 2022-09-12 DIAGNOSIS — M9904 Segmental and somatic dysfunction of sacral region: Secondary | ICD-10-CM | POA: Diagnosis not present

## 2022-09-12 DIAGNOSIS — M25572 Pain in left ankle and joints of left foot: Secondary | ICD-10-CM | POA: Diagnosis not present

## 2022-09-12 DIAGNOSIS — M9903 Segmental and somatic dysfunction of lumbar region: Secondary | ICD-10-CM | POA: Diagnosis not present

## 2022-09-26 DIAGNOSIS — J301 Allergic rhinitis due to pollen: Secondary | ICD-10-CM | POA: Diagnosis not present

## 2022-09-26 DIAGNOSIS — J3089 Other allergic rhinitis: Secondary | ICD-10-CM | POA: Diagnosis not present

## 2022-09-26 DIAGNOSIS — J3081 Allergic rhinitis due to animal (cat) (dog) hair and dander: Secondary | ICD-10-CM | POA: Diagnosis not present

## 2022-11-28 DIAGNOSIS — J301 Allergic rhinitis due to pollen: Secondary | ICD-10-CM | POA: Diagnosis not present

## 2022-11-28 DIAGNOSIS — J3089 Other allergic rhinitis: Secondary | ICD-10-CM | POA: Diagnosis not present

## 2022-12-04 DIAGNOSIS — H04123 Dry eye syndrome of bilateral lacrimal glands: Secondary | ICD-10-CM | POA: Diagnosis not present

## 2022-12-04 DIAGNOSIS — H1013 Acute atopic conjunctivitis, bilateral: Secondary | ICD-10-CM | POA: Diagnosis not present

## 2022-12-05 DIAGNOSIS — J3089 Other allergic rhinitis: Secondary | ICD-10-CM | POA: Diagnosis not present

## 2022-12-05 DIAGNOSIS — J301 Allergic rhinitis due to pollen: Secondary | ICD-10-CM | POA: Diagnosis not present

## 2022-12-12 DIAGNOSIS — J3089 Other allergic rhinitis: Secondary | ICD-10-CM | POA: Diagnosis not present

## 2022-12-12 DIAGNOSIS — J301 Allergic rhinitis due to pollen: Secondary | ICD-10-CM | POA: Diagnosis not present

## 2022-12-17 DIAGNOSIS — E785 Hyperlipidemia, unspecified: Secondary | ICD-10-CM | POA: Diagnosis not present

## 2022-12-17 DIAGNOSIS — I10 Essential (primary) hypertension: Secondary | ICD-10-CM | POA: Diagnosis not present

## 2022-12-17 DIAGNOSIS — F411 Generalized anxiety disorder: Secondary | ICD-10-CM | POA: Diagnosis not present

## 2022-12-19 DIAGNOSIS — M9904 Segmental and somatic dysfunction of sacral region: Secondary | ICD-10-CM | POA: Diagnosis not present

## 2022-12-19 DIAGNOSIS — J3089 Other allergic rhinitis: Secondary | ICD-10-CM | POA: Diagnosis not present

## 2022-12-19 DIAGNOSIS — M25572 Pain in left ankle and joints of left foot: Secondary | ICD-10-CM | POA: Diagnosis not present

## 2022-12-19 DIAGNOSIS — J301 Allergic rhinitis due to pollen: Secondary | ICD-10-CM | POA: Diagnosis not present

## 2022-12-19 DIAGNOSIS — M9903 Segmental and somatic dysfunction of lumbar region: Secondary | ICD-10-CM | POA: Diagnosis not present

## 2022-12-19 DIAGNOSIS — M9901 Segmental and somatic dysfunction of cervical region: Secondary | ICD-10-CM | POA: Diagnosis not present

## 2022-12-26 DIAGNOSIS — J3081 Allergic rhinitis due to animal (cat) (dog) hair and dander: Secondary | ICD-10-CM | POA: Diagnosis not present

## 2022-12-26 DIAGNOSIS — J3089 Other allergic rhinitis: Secondary | ICD-10-CM | POA: Diagnosis not present

## 2022-12-26 DIAGNOSIS — J301 Allergic rhinitis due to pollen: Secondary | ICD-10-CM | POA: Diagnosis not present

## 2023-01-16 DIAGNOSIS — J3089 Other allergic rhinitis: Secondary | ICD-10-CM | POA: Diagnosis not present

## 2023-01-16 DIAGNOSIS — J3081 Allergic rhinitis due to animal (cat) (dog) hair and dander: Secondary | ICD-10-CM | POA: Diagnosis not present

## 2023-01-16 DIAGNOSIS — J301 Allergic rhinitis due to pollen: Secondary | ICD-10-CM | POA: Diagnosis not present

## 2023-02-04 DIAGNOSIS — M0600 Rheumatoid arthritis without rheumatoid factor, unspecified site: Secondary | ICD-10-CM | POA: Diagnosis not present

## 2023-02-04 DIAGNOSIS — R5383 Other fatigue: Secondary | ICD-10-CM | POA: Diagnosis not present

## 2023-02-04 DIAGNOSIS — M1991 Primary osteoarthritis, unspecified site: Secondary | ICD-10-CM | POA: Diagnosis not present

## 2023-02-04 DIAGNOSIS — Z79899 Other long term (current) drug therapy: Secondary | ICD-10-CM | POA: Diagnosis not present

## 2023-02-06 DIAGNOSIS — J3089 Other allergic rhinitis: Secondary | ICD-10-CM | POA: Diagnosis not present

## 2023-02-06 DIAGNOSIS — J3081 Allergic rhinitis due to animal (cat) (dog) hair and dander: Secondary | ICD-10-CM | POA: Diagnosis not present

## 2023-02-06 DIAGNOSIS — J301 Allergic rhinitis due to pollen: Secondary | ICD-10-CM | POA: Diagnosis not present

## 2023-02-13 DIAGNOSIS — J3081 Allergic rhinitis due to animal (cat) (dog) hair and dander: Secondary | ICD-10-CM | POA: Diagnosis not present

## 2023-02-13 DIAGNOSIS — J3089 Other allergic rhinitis: Secondary | ICD-10-CM | POA: Diagnosis not present

## 2023-02-13 DIAGNOSIS — J301 Allergic rhinitis due to pollen: Secondary | ICD-10-CM | POA: Diagnosis not present

## 2023-03-06 DIAGNOSIS — J3089 Other allergic rhinitis: Secondary | ICD-10-CM | POA: Diagnosis not present

## 2023-03-06 DIAGNOSIS — J301 Allergic rhinitis due to pollen: Secondary | ICD-10-CM | POA: Diagnosis not present

## 2023-03-13 DIAGNOSIS — J3089 Other allergic rhinitis: Secondary | ICD-10-CM | POA: Diagnosis not present

## 2023-03-13 DIAGNOSIS — J301 Allergic rhinitis due to pollen: Secondary | ICD-10-CM | POA: Diagnosis not present

## 2023-03-20 DIAGNOSIS — J301 Allergic rhinitis due to pollen: Secondary | ICD-10-CM | POA: Diagnosis not present

## 2023-03-20 DIAGNOSIS — J3081 Allergic rhinitis due to animal (cat) (dog) hair and dander: Secondary | ICD-10-CM | POA: Diagnosis not present

## 2023-03-20 DIAGNOSIS — J3089 Other allergic rhinitis: Secondary | ICD-10-CM | POA: Diagnosis not present

## 2023-06-25 DIAGNOSIS — Z124 Encounter for screening for malignant neoplasm of cervix: Secondary | ICD-10-CM | POA: Diagnosis not present

## 2023-06-25 DIAGNOSIS — Z0000 Encounter for general adult medical examination without abnormal findings: Secondary | ICD-10-CM | POA: Diagnosis not present

## 2023-07-03 DIAGNOSIS — R21 Rash and other nonspecific skin eruption: Secondary | ICD-10-CM | POA: Diagnosis not present

## 2023-07-03 DIAGNOSIS — J3089 Other allergic rhinitis: Secondary | ICD-10-CM | POA: Diagnosis not present

## 2023-07-03 DIAGNOSIS — J301 Allergic rhinitis due to pollen: Secondary | ICD-10-CM | POA: Diagnosis not present

## 2023-07-03 DIAGNOSIS — J3081 Allergic rhinitis due to animal (cat) (dog) hair and dander: Secondary | ICD-10-CM | POA: Diagnosis not present

## 2023-07-09 DIAGNOSIS — J301 Allergic rhinitis due to pollen: Secondary | ICD-10-CM | POA: Diagnosis not present

## 2023-07-10 DIAGNOSIS — J3089 Other allergic rhinitis: Secondary | ICD-10-CM | POA: Diagnosis not present

## 2023-07-16 DIAGNOSIS — J301 Allergic rhinitis due to pollen: Secondary | ICD-10-CM | POA: Diagnosis not present

## 2023-07-16 DIAGNOSIS — J3089 Other allergic rhinitis: Secondary | ICD-10-CM | POA: Diagnosis not present

## 2023-07-19 DIAGNOSIS — J301 Allergic rhinitis due to pollen: Secondary | ICD-10-CM | POA: Diagnosis not present

## 2023-07-19 DIAGNOSIS — J3089 Other allergic rhinitis: Secondary | ICD-10-CM | POA: Diagnosis not present

## 2023-07-23 DIAGNOSIS — J3089 Other allergic rhinitis: Secondary | ICD-10-CM | POA: Diagnosis not present

## 2023-07-23 DIAGNOSIS — J301 Allergic rhinitis due to pollen: Secondary | ICD-10-CM | POA: Diagnosis not present

## 2023-07-31 DIAGNOSIS — M1991 Primary osteoarthritis, unspecified site: Secondary | ICD-10-CM | POA: Diagnosis not present

## 2023-07-31 DIAGNOSIS — M0600 Rheumatoid arthritis without rheumatoid factor, unspecified site: Secondary | ICD-10-CM | POA: Diagnosis not present

## 2023-07-31 DIAGNOSIS — J3089 Other allergic rhinitis: Secondary | ICD-10-CM | POA: Diagnosis not present

## 2023-07-31 DIAGNOSIS — J301 Allergic rhinitis due to pollen: Secondary | ICD-10-CM | POA: Diagnosis not present

## 2023-07-31 DIAGNOSIS — Z79899 Other long term (current) drug therapy: Secondary | ICD-10-CM | POA: Diagnosis not present

## 2023-08-01 DIAGNOSIS — M79672 Pain in left foot: Secondary | ICD-10-CM | POA: Diagnosis not present

## 2023-08-07 DIAGNOSIS — J3089 Other allergic rhinitis: Secondary | ICD-10-CM | POA: Diagnosis not present

## 2023-08-07 DIAGNOSIS — J3081 Allergic rhinitis due to animal (cat) (dog) hair and dander: Secondary | ICD-10-CM | POA: Diagnosis not present

## 2023-08-07 DIAGNOSIS — J301 Allergic rhinitis due to pollen: Secondary | ICD-10-CM | POA: Diagnosis not present

## 2023-08-12 ENCOUNTER — Other Ambulatory Visit: Payer: Self-pay | Admitting: Family Medicine

## 2023-08-12 DIAGNOSIS — Z1231 Encounter for screening mammogram for malignant neoplasm of breast: Secondary | ICD-10-CM

## 2023-08-14 DIAGNOSIS — J3081 Allergic rhinitis due to animal (cat) (dog) hair and dander: Secondary | ICD-10-CM | POA: Diagnosis not present

## 2023-08-14 DIAGNOSIS — J301 Allergic rhinitis due to pollen: Secondary | ICD-10-CM | POA: Diagnosis not present

## 2023-08-14 DIAGNOSIS — J3089 Other allergic rhinitis: Secondary | ICD-10-CM | POA: Diagnosis not present

## 2023-08-21 DIAGNOSIS — J3081 Allergic rhinitis due to animal (cat) (dog) hair and dander: Secondary | ICD-10-CM | POA: Diagnosis not present

## 2023-08-21 DIAGNOSIS — J3089 Other allergic rhinitis: Secondary | ICD-10-CM | POA: Diagnosis not present

## 2023-08-21 DIAGNOSIS — J301 Allergic rhinitis due to pollen: Secondary | ICD-10-CM | POA: Diagnosis not present

## 2023-08-28 DIAGNOSIS — J3089 Other allergic rhinitis: Secondary | ICD-10-CM | POA: Diagnosis not present

## 2023-08-28 DIAGNOSIS — J301 Allergic rhinitis due to pollen: Secondary | ICD-10-CM | POA: Diagnosis not present

## 2023-08-29 DIAGNOSIS — M7672 Peroneal tendinitis, left leg: Secondary | ICD-10-CM | POA: Diagnosis not present

## 2023-08-29 DIAGNOSIS — M792 Neuralgia and neuritis, unspecified: Secondary | ICD-10-CM | POA: Diagnosis not present

## 2023-09-04 DIAGNOSIS — J301 Allergic rhinitis due to pollen: Secondary | ICD-10-CM | POA: Diagnosis not present

## 2023-09-04 DIAGNOSIS — J3089 Other allergic rhinitis: Secondary | ICD-10-CM | POA: Diagnosis not present

## 2023-09-11 DIAGNOSIS — M21962 Unspecified acquired deformity of left lower leg: Secondary | ICD-10-CM | POA: Diagnosis not present

## 2023-09-11 DIAGNOSIS — G5762 Lesion of plantar nerve, left lower limb: Secondary | ICD-10-CM | POA: Diagnosis not present

## 2023-09-13 ENCOUNTER — Ambulatory Visit
Admission: RE | Admit: 2023-09-13 | Discharge: 2023-09-13 | Disposition: A | Discharge status: Home / Self Care | Payer: BC Managed Care – PPO | Source: Ambulatory Visit

## 2023-09-13 ENCOUNTER — Other Ambulatory Visit: Payer: Self-pay | Admitting: Family Medicine

## 2023-09-13 DIAGNOSIS — Z1231 Encounter for screening mammogram for malignant neoplasm of breast: Secondary | ICD-10-CM

## 2023-09-18 DIAGNOSIS — J3081 Allergic rhinitis due to animal (cat) (dog) hair and dander: Secondary | ICD-10-CM | POA: Diagnosis not present

## 2023-09-18 DIAGNOSIS — J301 Allergic rhinitis due to pollen: Secondary | ICD-10-CM | POA: Diagnosis not present

## 2023-09-18 DIAGNOSIS — J3089 Other allergic rhinitis: Secondary | ICD-10-CM | POA: Diagnosis not present

## 2023-09-24 DIAGNOSIS — G5762 Lesion of plantar nerve, left lower limb: Secondary | ICD-10-CM | POA: Diagnosis not present

## 2023-09-25 DIAGNOSIS — F411 Generalized anxiety disorder: Secondary | ICD-10-CM | POA: Diagnosis not present

## 2023-09-25 DIAGNOSIS — I10 Essential (primary) hypertension: Secondary | ICD-10-CM | POA: Diagnosis not present

## 2023-09-25 DIAGNOSIS — J3089 Other allergic rhinitis: Secondary | ICD-10-CM | POA: Diagnosis not present

## 2023-09-25 DIAGNOSIS — J301 Allergic rhinitis due to pollen: Secondary | ICD-10-CM | POA: Diagnosis not present

## 2023-10-09 DIAGNOSIS — J3081 Allergic rhinitis due to animal (cat) (dog) hair and dander: Secondary | ICD-10-CM | POA: Diagnosis not present

## 2023-10-09 DIAGNOSIS — J3089 Other allergic rhinitis: Secondary | ICD-10-CM | POA: Diagnosis not present

## 2023-10-09 DIAGNOSIS — J301 Allergic rhinitis due to pollen: Secondary | ICD-10-CM | POA: Diagnosis not present

## 2023-10-16 DIAGNOSIS — J301 Allergic rhinitis due to pollen: Secondary | ICD-10-CM | POA: Diagnosis not present

## 2023-10-16 DIAGNOSIS — J3081 Allergic rhinitis due to animal (cat) (dog) hair and dander: Secondary | ICD-10-CM | POA: Diagnosis not present

## 2023-10-16 DIAGNOSIS — J3089 Other allergic rhinitis: Secondary | ICD-10-CM | POA: Diagnosis not present

## 2023-10-21 DIAGNOSIS — G5762 Lesion of plantar nerve, left lower limb: Secondary | ICD-10-CM | POA: Diagnosis not present

## 2023-10-22 DIAGNOSIS — G5762 Lesion of plantar nerve, left lower limb: Secondary | ICD-10-CM | POA: Diagnosis not present

## 2023-10-23 DIAGNOSIS — J301 Allergic rhinitis due to pollen: Secondary | ICD-10-CM | POA: Diagnosis not present

## 2023-10-23 DIAGNOSIS — J3089 Other allergic rhinitis: Secondary | ICD-10-CM | POA: Diagnosis not present

## 2023-11-06 DIAGNOSIS — J3089 Other allergic rhinitis: Secondary | ICD-10-CM | POA: Diagnosis not present

## 2023-11-06 DIAGNOSIS — J301 Allergic rhinitis due to pollen: Secondary | ICD-10-CM | POA: Diagnosis not present

## 2023-11-11 DIAGNOSIS — J3089 Other allergic rhinitis: Secondary | ICD-10-CM | POA: Diagnosis not present

## 2023-11-11 DIAGNOSIS — J3081 Allergic rhinitis due to animal (cat) (dog) hair and dander: Secondary | ICD-10-CM | POA: Diagnosis not present

## 2023-11-11 DIAGNOSIS — J301 Allergic rhinitis due to pollen: Secondary | ICD-10-CM | POA: Diagnosis not present

## 2023-11-19 DIAGNOSIS — G5762 Lesion of plantar nerve, left lower limb: Secondary | ICD-10-CM | POA: Diagnosis not present

## 2023-12-18 DIAGNOSIS — J3081 Allergic rhinitis due to animal (cat) (dog) hair and dander: Secondary | ICD-10-CM | POA: Diagnosis not present

## 2023-12-18 DIAGNOSIS — J3089 Other allergic rhinitis: Secondary | ICD-10-CM | POA: Diagnosis not present

## 2023-12-18 DIAGNOSIS — J301 Allergic rhinitis due to pollen: Secondary | ICD-10-CM | POA: Diagnosis not present

## 2023-12-25 DIAGNOSIS — J301 Allergic rhinitis due to pollen: Secondary | ICD-10-CM | POA: Diagnosis not present

## 2023-12-25 DIAGNOSIS — J3081 Allergic rhinitis due to animal (cat) (dog) hair and dander: Secondary | ICD-10-CM | POA: Diagnosis not present

## 2023-12-25 DIAGNOSIS — J3089 Other allergic rhinitis: Secondary | ICD-10-CM | POA: Diagnosis not present

## 2023-12-27 DIAGNOSIS — I10 Essential (primary) hypertension: Secondary | ICD-10-CM | POA: Diagnosis not present

## 2023-12-27 DIAGNOSIS — M549 Dorsalgia, unspecified: Secondary | ICD-10-CM | POA: Diagnosis not present

## 2023-12-27 DIAGNOSIS — F411 Generalized anxiety disorder: Secondary | ICD-10-CM | POA: Diagnosis not present

## 2024-01-29 DIAGNOSIS — Z79899 Other long term (current) drug therapy: Secondary | ICD-10-CM | POA: Diagnosis not present

## 2024-01-29 DIAGNOSIS — M0600 Rheumatoid arthritis without rheumatoid factor, unspecified site: Secondary | ICD-10-CM | POA: Diagnosis not present

## 2024-01-29 DIAGNOSIS — M1991 Primary osteoarthritis, unspecified site: Secondary | ICD-10-CM | POA: Diagnosis not present

## 2024-01-29 DIAGNOSIS — R5383 Other fatigue: Secondary | ICD-10-CM | POA: Diagnosis not present

## 2024-03-30 DIAGNOSIS — E785 Hyperlipidemia, unspecified: Secondary | ICD-10-CM | POA: Diagnosis not present

## 2024-03-30 DIAGNOSIS — I10 Essential (primary) hypertension: Secondary | ICD-10-CM | POA: Diagnosis not present

## 2024-03-30 DIAGNOSIS — F411 Generalized anxiety disorder: Secondary | ICD-10-CM | POA: Diagnosis not present

## 2024-05-01 DIAGNOSIS — M25572 Pain in left ankle and joints of left foot: Secondary | ICD-10-CM | POA: Diagnosis not present

## 2024-05-01 DIAGNOSIS — M79672 Pain in left foot: Secondary | ICD-10-CM | POA: Diagnosis not present

## 2024-05-12 DIAGNOSIS — J3081 Allergic rhinitis due to animal (cat) (dog) hair and dander: Secondary | ICD-10-CM | POA: Diagnosis not present

## 2024-05-12 DIAGNOSIS — J3089 Other allergic rhinitis: Secondary | ICD-10-CM | POA: Diagnosis not present

## 2024-05-12 DIAGNOSIS — J301 Allergic rhinitis due to pollen: Secondary | ICD-10-CM | POA: Diagnosis not present

## 2024-05-12 DIAGNOSIS — R21 Rash and other nonspecific skin eruption: Secondary | ICD-10-CM | POA: Diagnosis not present

## 2024-05-14 DIAGNOSIS — J301 Allergic rhinitis due to pollen: Secondary | ICD-10-CM | POA: Diagnosis not present

## 2024-05-15 DIAGNOSIS — J3089 Other allergic rhinitis: Secondary | ICD-10-CM | POA: Diagnosis not present

## 2024-05-19 DIAGNOSIS — J3081 Allergic rhinitis due to animal (cat) (dog) hair and dander: Secondary | ICD-10-CM | POA: Diagnosis not present

## 2024-05-19 DIAGNOSIS — J3089 Other allergic rhinitis: Secondary | ICD-10-CM | POA: Diagnosis not present

## 2024-05-19 DIAGNOSIS — J301 Allergic rhinitis due to pollen: Secondary | ICD-10-CM | POA: Diagnosis not present

## 2024-05-21 DIAGNOSIS — J3089 Other allergic rhinitis: Secondary | ICD-10-CM | POA: Diagnosis not present

## 2024-05-21 DIAGNOSIS — J3081 Allergic rhinitis due to animal (cat) (dog) hair and dander: Secondary | ICD-10-CM | POA: Diagnosis not present

## 2024-05-21 DIAGNOSIS — J301 Allergic rhinitis due to pollen: Secondary | ICD-10-CM | POA: Diagnosis not present

## 2024-05-24 DIAGNOSIS — M79672 Pain in left foot: Secondary | ICD-10-CM | POA: Diagnosis not present

## 2024-05-24 DIAGNOSIS — M25572 Pain in left ankle and joints of left foot: Secondary | ICD-10-CM | POA: Diagnosis not present

## 2024-05-25 DIAGNOSIS — J3089 Other allergic rhinitis: Secondary | ICD-10-CM | POA: Diagnosis not present

## 2024-05-25 DIAGNOSIS — J301 Allergic rhinitis due to pollen: Secondary | ICD-10-CM | POA: Diagnosis not present

## 2024-05-25 DIAGNOSIS — H1013 Acute atopic conjunctivitis, bilateral: Secondary | ICD-10-CM | POA: Diagnosis not present

## 2024-05-25 DIAGNOSIS — J3081 Allergic rhinitis due to animal (cat) (dog) hair and dander: Secondary | ICD-10-CM | POA: Diagnosis not present

## 2024-05-25 DIAGNOSIS — H04123 Dry eye syndrome of bilateral lacrimal glands: Secondary | ICD-10-CM | POA: Diagnosis not present

## 2024-05-28 DIAGNOSIS — J3081 Allergic rhinitis due to animal (cat) (dog) hair and dander: Secondary | ICD-10-CM | POA: Diagnosis not present

## 2024-05-28 DIAGNOSIS — J301 Allergic rhinitis due to pollen: Secondary | ICD-10-CM | POA: Diagnosis not present

## 2024-05-28 DIAGNOSIS — J3089 Other allergic rhinitis: Secondary | ICD-10-CM | POA: Diagnosis not present

## 2024-06-01 DIAGNOSIS — M25572 Pain in left ankle and joints of left foot: Secondary | ICD-10-CM | POA: Diagnosis not present

## 2024-06-01 DIAGNOSIS — M79672 Pain in left foot: Secondary | ICD-10-CM | POA: Diagnosis not present

## 2024-06-02 DIAGNOSIS — J301 Allergic rhinitis due to pollen: Secondary | ICD-10-CM | POA: Diagnosis not present

## 2024-06-02 DIAGNOSIS — J3089 Other allergic rhinitis: Secondary | ICD-10-CM | POA: Diagnosis not present

## 2024-06-04 DIAGNOSIS — J301 Allergic rhinitis due to pollen: Secondary | ICD-10-CM | POA: Diagnosis not present

## 2024-06-04 DIAGNOSIS — J3089 Other allergic rhinitis: Secondary | ICD-10-CM | POA: Diagnosis not present

## 2024-06-04 DIAGNOSIS — J3081 Allergic rhinitis due to animal (cat) (dog) hair and dander: Secondary | ICD-10-CM | POA: Diagnosis not present

## 2024-06-10 DIAGNOSIS — J3081 Allergic rhinitis due to animal (cat) (dog) hair and dander: Secondary | ICD-10-CM | POA: Diagnosis not present

## 2024-06-10 DIAGNOSIS — J301 Allergic rhinitis due to pollen: Secondary | ICD-10-CM | POA: Diagnosis not present

## 2024-06-10 DIAGNOSIS — J3089 Other allergic rhinitis: Secondary | ICD-10-CM | POA: Diagnosis not present

## 2024-06-16 DIAGNOSIS — J301 Allergic rhinitis due to pollen: Secondary | ICD-10-CM | POA: Diagnosis not present

## 2024-06-16 DIAGNOSIS — J3081 Allergic rhinitis due to animal (cat) (dog) hair and dander: Secondary | ICD-10-CM | POA: Diagnosis not present

## 2024-06-16 DIAGNOSIS — J3089 Other allergic rhinitis: Secondary | ICD-10-CM | POA: Diagnosis not present

## 2024-06-18 DIAGNOSIS — J3089 Other allergic rhinitis: Secondary | ICD-10-CM | POA: Diagnosis not present

## 2024-06-18 DIAGNOSIS — J3081 Allergic rhinitis due to animal (cat) (dog) hair and dander: Secondary | ICD-10-CM | POA: Diagnosis not present

## 2024-06-18 DIAGNOSIS — J301 Allergic rhinitis due to pollen: Secondary | ICD-10-CM | POA: Diagnosis not present

## 2024-06-24 DIAGNOSIS — J3089 Other allergic rhinitis: Secondary | ICD-10-CM | POA: Diagnosis not present

## 2024-06-24 DIAGNOSIS — J301 Allergic rhinitis due to pollen: Secondary | ICD-10-CM | POA: Diagnosis not present

## 2024-06-24 DIAGNOSIS — J3081 Allergic rhinitis due to animal (cat) (dog) hair and dander: Secondary | ICD-10-CM | POA: Diagnosis not present

## 2024-06-29 DIAGNOSIS — M7742 Metatarsalgia, left foot: Secondary | ICD-10-CM | POA: Diagnosis not present

## 2024-06-29 DIAGNOSIS — M722 Plantar fascial fibromatosis: Secondary | ICD-10-CM | POA: Diagnosis not present

## 2024-06-29 DIAGNOSIS — M2042 Other hammer toe(s) (acquired), left foot: Secondary | ICD-10-CM | POA: Diagnosis not present

## 2024-06-30 DIAGNOSIS — J3089 Other allergic rhinitis: Secondary | ICD-10-CM | POA: Diagnosis not present

## 2024-06-30 DIAGNOSIS — J3081 Allergic rhinitis due to animal (cat) (dog) hair and dander: Secondary | ICD-10-CM | POA: Diagnosis not present

## 2024-06-30 DIAGNOSIS — J301 Allergic rhinitis due to pollen: Secondary | ICD-10-CM | POA: Diagnosis not present

## 2024-07-02 DIAGNOSIS — J3081 Allergic rhinitis due to animal (cat) (dog) hair and dander: Secondary | ICD-10-CM | POA: Diagnosis not present

## 2024-07-02 DIAGNOSIS — J301 Allergic rhinitis due to pollen: Secondary | ICD-10-CM | POA: Diagnosis not present

## 2024-07-02 DIAGNOSIS — J3089 Other allergic rhinitis: Secondary | ICD-10-CM | POA: Diagnosis not present

## 2024-07-06 DIAGNOSIS — I10 Essential (primary) hypertension: Secondary | ICD-10-CM | POA: Diagnosis not present

## 2024-07-06 DIAGNOSIS — E785 Hyperlipidemia, unspecified: Secondary | ICD-10-CM | POA: Diagnosis not present

## 2024-07-06 DIAGNOSIS — E559 Vitamin D deficiency, unspecified: Secondary | ICD-10-CM | POA: Diagnosis not present

## 2024-07-06 DIAGNOSIS — R7303 Prediabetes: Secondary | ICD-10-CM | POA: Diagnosis not present

## 2024-07-06 DIAGNOSIS — Z0000 Encounter for general adult medical examination without abnormal findings: Secondary | ICD-10-CM | POA: Diagnosis not present

## 2024-07-08 DIAGNOSIS — J3089 Other allergic rhinitis: Secondary | ICD-10-CM | POA: Diagnosis not present

## 2024-07-08 DIAGNOSIS — J3081 Allergic rhinitis due to animal (cat) (dog) hair and dander: Secondary | ICD-10-CM | POA: Diagnosis not present

## 2024-07-08 DIAGNOSIS — J301 Allergic rhinitis due to pollen: Secondary | ICD-10-CM | POA: Diagnosis not present

## 2024-07-14 DIAGNOSIS — J3081 Allergic rhinitis due to animal (cat) (dog) hair and dander: Secondary | ICD-10-CM | POA: Diagnosis not present

## 2024-07-14 DIAGNOSIS — J3089 Other allergic rhinitis: Secondary | ICD-10-CM | POA: Diagnosis not present

## 2024-07-14 DIAGNOSIS — J301 Allergic rhinitis due to pollen: Secondary | ICD-10-CM | POA: Diagnosis not present

## 2024-07-21 DIAGNOSIS — M7742 Metatarsalgia, left foot: Secondary | ICD-10-CM | POA: Diagnosis not present

## 2024-07-21 DIAGNOSIS — G8918 Other acute postprocedural pain: Secondary | ICD-10-CM | POA: Diagnosis not present

## 2024-07-21 DIAGNOSIS — M2042 Other hammer toe(s) (acquired), left foot: Secondary | ICD-10-CM | POA: Diagnosis not present

## 2024-07-21 DIAGNOSIS — M722 Plantar fascial fibromatosis: Secondary | ICD-10-CM | POA: Diagnosis not present

## 2024-08-05 DIAGNOSIS — M722 Plantar fascial fibromatosis: Secondary | ICD-10-CM | POA: Diagnosis not present

## 2024-08-06 ENCOUNTER — Other Ambulatory Visit: Payer: Self-pay | Admitting: Family Medicine

## 2024-08-06 DIAGNOSIS — Z1231 Encounter for screening mammogram for malignant neoplasm of breast: Secondary | ICD-10-CM

## 2024-08-12 DIAGNOSIS — J301 Allergic rhinitis due to pollen: Secondary | ICD-10-CM | POA: Diagnosis not present

## 2024-08-12 DIAGNOSIS — J3089 Other allergic rhinitis: Secondary | ICD-10-CM | POA: Diagnosis not present

## 2024-08-18 DIAGNOSIS — J301 Allergic rhinitis due to pollen: Secondary | ICD-10-CM | POA: Diagnosis not present

## 2024-08-18 DIAGNOSIS — J3089 Other allergic rhinitis: Secondary | ICD-10-CM | POA: Diagnosis not present

## 2024-08-20 DIAGNOSIS — J301 Allergic rhinitis due to pollen: Secondary | ICD-10-CM | POA: Diagnosis not present

## 2024-08-20 DIAGNOSIS — J3089 Other allergic rhinitis: Secondary | ICD-10-CM | POA: Diagnosis not present

## 2024-08-20 DIAGNOSIS — J3081 Allergic rhinitis due to animal (cat) (dog) hair and dander: Secondary | ICD-10-CM | POA: Diagnosis not present

## 2024-08-24 DIAGNOSIS — J3089 Other allergic rhinitis: Secondary | ICD-10-CM | POA: Diagnosis not present

## 2024-08-24 DIAGNOSIS — J301 Allergic rhinitis due to pollen: Secondary | ICD-10-CM | POA: Diagnosis not present

## 2024-09-01 DIAGNOSIS — J3089 Other allergic rhinitis: Secondary | ICD-10-CM | POA: Diagnosis not present

## 2024-09-01 DIAGNOSIS — J3081 Allergic rhinitis due to animal (cat) (dog) hair and dander: Secondary | ICD-10-CM | POA: Diagnosis not present

## 2024-09-01 DIAGNOSIS — J301 Allergic rhinitis due to pollen: Secondary | ICD-10-CM | POA: Diagnosis not present

## 2024-09-04 DIAGNOSIS — M7742 Metatarsalgia, left foot: Secondary | ICD-10-CM | POA: Diagnosis not present

## 2024-09-07 DIAGNOSIS — J301 Allergic rhinitis due to pollen: Secondary | ICD-10-CM | POA: Diagnosis not present

## 2024-09-07 DIAGNOSIS — M0600 Rheumatoid arthritis without rheumatoid factor, unspecified site: Secondary | ICD-10-CM | POA: Diagnosis not present

## 2024-09-07 DIAGNOSIS — J3081 Allergic rhinitis due to animal (cat) (dog) hair and dander: Secondary | ICD-10-CM | POA: Diagnosis not present

## 2024-09-07 DIAGNOSIS — J3089 Other allergic rhinitis: Secondary | ICD-10-CM | POA: Diagnosis not present

## 2024-09-07 DIAGNOSIS — M1991 Primary osteoarthritis, unspecified site: Secondary | ICD-10-CM | POA: Diagnosis not present

## 2024-09-07 DIAGNOSIS — Z79899 Other long term (current) drug therapy: Secondary | ICD-10-CM | POA: Diagnosis not present

## 2024-09-07 DIAGNOSIS — R5383 Other fatigue: Secondary | ICD-10-CM | POA: Diagnosis not present

## 2024-09-09 DIAGNOSIS — E669 Obesity, unspecified: Secondary | ICD-10-CM | POA: Diagnosis not present

## 2024-09-09 DIAGNOSIS — I10 Essential (primary) hypertension: Secondary | ICD-10-CM | POA: Diagnosis not present

## 2024-09-09 DIAGNOSIS — G4730 Sleep apnea, unspecified: Secondary | ICD-10-CM | POA: Diagnosis not present

## 2024-09-14 ENCOUNTER — Ambulatory Visit
Admission: RE | Admit: 2024-09-14 | Discharge: 2024-09-14 | Disposition: A | Source: Ambulatory Visit | Attending: Family Medicine | Admitting: Family Medicine

## 2024-09-14 DIAGNOSIS — J3081 Allergic rhinitis due to animal (cat) (dog) hair and dander: Secondary | ICD-10-CM | POA: Diagnosis not present

## 2024-09-14 DIAGNOSIS — J3089 Other allergic rhinitis: Secondary | ICD-10-CM | POA: Diagnosis not present

## 2024-09-14 DIAGNOSIS — Z1231 Encounter for screening mammogram for malignant neoplasm of breast: Secondary | ICD-10-CM | POA: Diagnosis not present

## 2024-09-14 DIAGNOSIS — J301 Allergic rhinitis due to pollen: Secondary | ICD-10-CM | POA: Diagnosis not present

## 2024-09-21 DIAGNOSIS — J301 Allergic rhinitis due to pollen: Secondary | ICD-10-CM | POA: Diagnosis not present

## 2024-09-21 DIAGNOSIS — J3081 Allergic rhinitis due to animal (cat) (dog) hair and dander: Secondary | ICD-10-CM | POA: Diagnosis not present

## 2024-09-21 DIAGNOSIS — J3089 Other allergic rhinitis: Secondary | ICD-10-CM | POA: Diagnosis not present

## 2024-09-28 DIAGNOSIS — G4733 Obstructive sleep apnea (adult) (pediatric): Secondary | ICD-10-CM | POA: Diagnosis not present

## 2024-09-28 DIAGNOSIS — J301 Allergic rhinitis due to pollen: Secondary | ICD-10-CM | POA: Diagnosis not present

## 2024-09-28 DIAGNOSIS — J3081 Allergic rhinitis due to animal (cat) (dog) hair and dander: Secondary | ICD-10-CM | POA: Diagnosis not present

## 2024-09-28 DIAGNOSIS — J3089 Other allergic rhinitis: Secondary | ICD-10-CM | POA: Diagnosis not present

## 2024-09-29 DIAGNOSIS — M79672 Pain in left foot: Secondary | ICD-10-CM | POA: Diagnosis not present

## 2024-10-06 DIAGNOSIS — J3081 Allergic rhinitis due to animal (cat) (dog) hair and dander: Secondary | ICD-10-CM | POA: Diagnosis not present

## 2024-10-06 DIAGNOSIS — I10 Essential (primary) hypertension: Secondary | ICD-10-CM | POA: Diagnosis not present

## 2024-10-06 DIAGNOSIS — G4733 Obstructive sleep apnea (adult) (pediatric): Secondary | ICD-10-CM | POA: Diagnosis not present

## 2024-10-06 DIAGNOSIS — J301 Allergic rhinitis due to pollen: Secondary | ICD-10-CM | POA: Diagnosis not present

## 2024-10-06 DIAGNOSIS — J3089 Other allergic rhinitis: Secondary | ICD-10-CM | POA: Diagnosis not present

## 2024-10-07 DIAGNOSIS — M2042 Other hammer toe(s) (acquired), left foot: Secondary | ICD-10-CM | POA: Diagnosis not present

## 2024-10-08 DIAGNOSIS — G4733 Obstructive sleep apnea (adult) (pediatric): Secondary | ICD-10-CM | POA: Diagnosis not present

## 2024-10-08 DIAGNOSIS — I10 Essential (primary) hypertension: Secondary | ICD-10-CM | POA: Diagnosis not present

## 2024-10-08 DIAGNOSIS — F411 Generalized anxiety disorder: Secondary | ICD-10-CM | POA: Diagnosis not present

## 2024-10-08 DIAGNOSIS — E785 Hyperlipidemia, unspecified: Secondary | ICD-10-CM | POA: Diagnosis not present

## 2024-10-09 DIAGNOSIS — M79672 Pain in left foot: Secondary | ICD-10-CM | POA: Diagnosis not present

## 2024-10-13 DIAGNOSIS — J3081 Allergic rhinitis due to animal (cat) (dog) hair and dander: Secondary | ICD-10-CM | POA: Diagnosis not present

## 2024-10-13 DIAGNOSIS — J3089 Other allergic rhinitis: Secondary | ICD-10-CM | POA: Diagnosis not present

## 2024-10-13 DIAGNOSIS — J301 Allergic rhinitis due to pollen: Secondary | ICD-10-CM | POA: Diagnosis not present

## 2024-10-20 DIAGNOSIS — M79672 Pain in left foot: Secondary | ICD-10-CM | POA: Diagnosis not present

## 2024-10-21 DIAGNOSIS — J3089 Other allergic rhinitis: Secondary | ICD-10-CM | POA: Diagnosis not present

## 2024-10-21 DIAGNOSIS — J301 Allergic rhinitis due to pollen: Secondary | ICD-10-CM | POA: Diagnosis not present

## 2024-10-21 DIAGNOSIS — J3081 Allergic rhinitis due to animal (cat) (dog) hair and dander: Secondary | ICD-10-CM | POA: Diagnosis not present

## 2024-10-23 DIAGNOSIS — M79672 Pain in left foot: Secondary | ICD-10-CM | POA: Diagnosis not present

## 2024-10-30 DIAGNOSIS — G4733 Obstructive sleep apnea (adult) (pediatric): Secondary | ICD-10-CM | POA: Diagnosis not present

## 2024-10-30 DIAGNOSIS — M79672 Pain in left foot: Secondary | ICD-10-CM | POA: Diagnosis not present

## 2024-11-03 DIAGNOSIS — J3089 Other allergic rhinitis: Secondary | ICD-10-CM | POA: Diagnosis not present

## 2024-11-03 DIAGNOSIS — J301 Allergic rhinitis due to pollen: Secondary | ICD-10-CM | POA: Diagnosis not present

## 2024-11-03 DIAGNOSIS — J3081 Allergic rhinitis due to animal (cat) (dog) hair and dander: Secondary | ICD-10-CM | POA: Diagnosis not present

## 2024-11-04 DIAGNOSIS — M79672 Pain in left foot: Secondary | ICD-10-CM | POA: Diagnosis not present

## 2024-11-10 DIAGNOSIS — I10 Essential (primary) hypertension: Secondary | ICD-10-CM | POA: Diagnosis not present

## 2024-11-10 DIAGNOSIS — G4733 Obstructive sleep apnea (adult) (pediatric): Secondary | ICD-10-CM | POA: Diagnosis not present

## 2024-11-11 DIAGNOSIS — J301 Allergic rhinitis due to pollen: Secondary | ICD-10-CM | POA: Diagnosis not present

## 2024-11-13 DIAGNOSIS — J3089 Other allergic rhinitis: Secondary | ICD-10-CM | POA: Diagnosis not present

## 2024-11-16 DIAGNOSIS — M79672 Pain in left foot: Secondary | ICD-10-CM | POA: Diagnosis not present
